# Patient Record
Sex: Male | Born: 1955 | Race: White | Hispanic: No | Marital: Single | Smoking: Current every day smoker
Health system: Southern US, Community
[De-identification: ages and names within clinical notes are randomized; demographics above are authoritative.]

## PROBLEM LIST (undated history)

## (undated) DIAGNOSIS — Z720 Tobacco use: Secondary | ICD-10-CM

## (undated) DIAGNOSIS — R590 Localized enlarged lymph nodes: Secondary | ICD-10-CM

## (undated) DIAGNOSIS — M069 Rheumatoid arthritis, unspecified: Secondary | ICD-10-CM

## (undated) DIAGNOSIS — R911 Solitary pulmonary nodule: Secondary | ICD-10-CM

## (undated) DIAGNOSIS — R600 Localized edema: Secondary | ICD-10-CM

## (undated) DIAGNOSIS — J439 Emphysema, unspecified: Secondary | ICD-10-CM

## (undated) DIAGNOSIS — J449 Chronic obstructive pulmonary disease, unspecified: Secondary | ICD-10-CM

## (undated) HISTORY — PX: INGUINAL HERNIA REPAIR: SUR1180

---

## 2011-06-13 ENCOUNTER — Ambulatory Visit: Payer: Self-pay | Admitting: Family Medicine

## 2011-06-21 ENCOUNTER — Other Ambulatory Visit (HOSPITAL_COMMUNITY): Payer: Self-pay | Admitting: Physician Assistant

## 2011-06-21 ENCOUNTER — Ambulatory Visit (HOSPITAL_COMMUNITY)
Admission: RE | Admit: 2011-06-21 | Discharge: 2011-06-21 | Disposition: A | Discharge status: Home / Self Care | Payer: 59 | Source: Ambulatory Visit | Attending: Physician Assistant | Admitting: Physician Assistant

## 2011-06-21 DIAGNOSIS — M25469 Effusion, unspecified knee: Secondary | ICD-10-CM | POA: Insufficient documentation

## 2011-06-21 DIAGNOSIS — M25569 Pain in unspecified knee: Secondary | ICD-10-CM | POA: Insufficient documentation

## 2011-07-27 DIAGNOSIS — M069 Rheumatoid arthritis, unspecified: Secondary | ICD-10-CM

## 2011-07-27 HISTORY — DX: Rheumatoid arthritis, unspecified: M06.9

## 2012-01-29 ENCOUNTER — Emergency Department (HOSPITAL_COMMUNITY): Payer: 59

## 2012-01-29 ENCOUNTER — Encounter (HOSPITAL_COMMUNITY): Payer: Self-pay

## 2012-01-29 ENCOUNTER — Inpatient Hospital Stay (HOSPITAL_COMMUNITY)
Admission: EM | Admit: 2012-01-29 | Discharge: 2012-01-31 | DRG: 947 | Disposition: A | Discharge status: Home / Self Care | Payer: 59 | Attending: Internal Medicine | Admitting: Internal Medicine

## 2012-01-29 DIAGNOSIS — I82409 Acute embolism and thrombosis of unspecified deep veins of unspecified lower extremity: Secondary | ICD-10-CM

## 2012-01-29 DIAGNOSIS — R599 Enlarged lymph nodes, unspecified: Secondary | ICD-10-CM | POA: Diagnosis present

## 2012-01-29 DIAGNOSIS — M2550 Pain in unspecified joint: Secondary | ICD-10-CM | POA: Diagnosis present

## 2012-01-29 DIAGNOSIS — L818 Other specified disorders of pigmentation: Secondary | ICD-10-CM | POA: Diagnosis present

## 2012-01-29 DIAGNOSIS — D509 Iron deficiency anemia, unspecified: Secondary | ICD-10-CM | POA: Diagnosis present

## 2012-01-29 DIAGNOSIS — F17200 Nicotine dependence, unspecified, uncomplicated: Secondary | ICD-10-CM | POA: Diagnosis present

## 2012-01-29 DIAGNOSIS — Z791 Long term (current) use of non-steroidal anti-inflammatories (NSAID): Secondary | ICD-10-CM

## 2012-01-29 DIAGNOSIS — T40605A Adverse effect of unspecified narcotics, initial encounter: Secondary | ICD-10-CM | POA: Diagnosis not present

## 2012-01-29 DIAGNOSIS — R609 Edema, unspecified: Principal | ICD-10-CM

## 2012-01-29 DIAGNOSIS — Y9210 Unspecified residential institution as the place of occurrence of the external cause: Secondary | ICD-10-CM | POA: Diagnosis not present

## 2012-01-29 DIAGNOSIS — T3995XA Adverse effect of unspecified nonopioid analgesic, antipyretic and antirheumatic, initial encounter: Secondary | ICD-10-CM | POA: Diagnosis present

## 2012-01-29 DIAGNOSIS — M62838 Other muscle spasm: Secondary | ICD-10-CM | POA: Diagnosis present

## 2012-01-29 DIAGNOSIS — Z720 Tobacco use: Secondary | ICD-10-CM | POA: Diagnosis present

## 2012-01-29 DIAGNOSIS — G9340 Encephalopathy, unspecified: Secondary | ICD-10-CM | POA: Diagnosis not present

## 2012-01-29 DIAGNOSIS — E8809 Other disorders of plasma-protein metabolism, not elsewhere classified: Secondary | ICD-10-CM | POA: Diagnosis present

## 2012-01-29 DIAGNOSIS — R590 Localized enlarged lymph nodes: Secondary | ICD-10-CM | POA: Diagnosis present

## 2012-01-29 DIAGNOSIS — J439 Emphysema, unspecified: Secondary | ICD-10-CM | POA: Diagnosis present

## 2012-01-29 DIAGNOSIS — J4489 Other specified chronic obstructive pulmonary disease: Secondary | ICD-10-CM | POA: Diagnosis present

## 2012-01-29 DIAGNOSIS — R600 Localized edema: Secondary | ICD-10-CM | POA: Diagnosis present

## 2012-01-29 DIAGNOSIS — N200 Calculus of kidney: Secondary | ICD-10-CM | POA: Diagnosis present

## 2012-01-29 DIAGNOSIS — D649 Anemia, unspecified: Secondary | ICD-10-CM | POA: Diagnosis present

## 2012-01-29 DIAGNOSIS — R911 Solitary pulmonary nodule: Secondary | ICD-10-CM | POA: Diagnosis present

## 2012-01-29 DIAGNOSIS — M069 Rheumatoid arthritis, unspecified: Secondary | ICD-10-CM | POA: Diagnosis present

## 2012-01-29 DIAGNOSIS — J449 Chronic obstructive pulmonary disease, unspecified: Secondary | ICD-10-CM

## 2012-01-29 DIAGNOSIS — Y92009 Unspecified place in unspecified non-institutional (private) residence as the place of occurrence of the external cause: Secondary | ICD-10-CM

## 2012-01-29 HISTORY — DX: Localized edema: R60.0

## 2012-01-29 HISTORY — DX: Rheumatoid arthritis, unspecified: M06.9

## 2012-01-29 HISTORY — DX: Chronic obstructive pulmonary disease, unspecified: J44.9

## 2012-01-29 HISTORY — DX: Tobacco use: Z72.0

## 2012-01-29 HISTORY — DX: Solitary pulmonary nodule: R91.1

## 2012-01-29 HISTORY — DX: Emphysema, unspecified: J43.9

## 2012-01-29 HISTORY — DX: Localized enlarged lymph nodes: R59.0

## 2012-01-29 LAB — COMPREHENSIVE METABOLIC PANEL
BUN: 6 mg/dL (ref 6–23)
CO2: 27 mEq/L (ref 19–32)
Chloride: 99 mEq/L (ref 96–112)
Creatinine, Ser: 0.43 mg/dL — ABNORMAL LOW (ref 0.50–1.35)
GFR calc Af Amer: >90 mL/min (ref >90)
GFR calc non Af Amer: >90 mL/min (ref >90)
Total Bilirubin: 0.2 mg/dL — ABNORMAL LOW (ref 0.3–1.2)

## 2012-01-29 LAB — CBC WITH DIFFERENTIAL/PLATELET
Eosinophils Relative: 1 % (ref 0–5)
HCT: 34 % — ABNORMAL LOW (ref 39.0–52.0)
Hemoglobin: 10.9 g/dL — ABNORMAL LOW (ref 13.0–17.0)
Lymphocytes Relative: 25 % (ref 12–46)
MCHC: 32.1 g/dL (ref 30.0–36.0)
MCV: 76.7 fL — ABNORMAL LOW (ref 78.0–100.0)
Monocytes Absolute: 0.4 10*3/uL (ref 0.1–1.0)
Monocytes Relative: 6 % (ref 3–12)
Neutro Abs: 4.4 10*3/uL (ref 1.7–7.7)

## 2012-01-29 LAB — URINALYSIS, ROUTINE W REFLEX MICROSCOPIC
Leukocytes, UA: NEGATIVE
Nitrite: NEGATIVE
Protein, ur: NEGATIVE mg/dL
Urobilinogen, UA: 2 mg/dL — ABNORMAL HIGH (ref 0.0–1.0)

## 2012-01-29 LAB — PRO B NATRIURETIC PEPTIDE: Pro B Natriuretic peptide (BNP): 222.7 pg/mL — ABNORMAL HIGH (ref 0–125)

## 2012-01-29 LAB — PROTIME-INR: Prothrombin Time: 14.6 seconds (ref 11.6–15.2)

## 2012-01-29 LAB — T4, FREE: Free T4: 0.95 ng/dL (ref 0.80–1.80)

## 2012-01-29 MED ORDER — NICOTINE 21 MG/24HR TD PT24
21.0000 mg | MEDICATED_PATCH | Freq: Every day | TRANSDERMAL | Status: DC
  Administered 2012-01-30 – 2012-01-31 (×2): 21 mg via TRANSDERMAL
  Filled 2012-01-29 (×3): qty 1

## 2012-01-29 MED ORDER — SODIUM CHLORIDE 0.9 % IJ SOLN
INTRAMUSCULAR | Status: AC
  Administered 2012-01-29: 21:00:00
  Filled 2012-01-29: qty 3

## 2012-01-29 MED ORDER — MORPHINE SULFATE 4 MG/ML IJ SOLN
4.0000 mg | INTRAMUSCULAR | Status: DC | PRN
  Administered 2012-01-30 – 2012-01-31 (×2): 4 mg via INTRAVENOUS
  Filled 2012-01-29 (×3): qty 1

## 2012-01-29 MED ORDER — ACETAMINOPHEN 650 MG RE SUPP: 650.0000 mg | Freq: Four times a day (QID) | RECTAL | Status: DC | PRN

## 2012-01-29 MED ORDER — ENOXAPARIN SODIUM 80 MG/0.8ML ~~LOC~~ SOLN
SUBCUTANEOUS | Status: AC
  Filled 2012-01-29: qty 0.8

## 2012-01-29 MED ORDER — ALBUTEROL SULFATE HFA 108 (90 BASE) MCG/ACT IN AERS
2.0000 | INHALATION_SPRAY | Freq: Three times a day (TID) | RESPIRATORY_TRACT | Status: DC
  Administered 2012-01-29 – 2012-01-31 (×6): 2 via RESPIRATORY_TRACT
  Filled 2012-01-29: qty 6.7

## 2012-01-29 MED ORDER — ONDANSETRON HCL 4 MG PO TABS: 4.0000 mg | ORAL_TABLET | Freq: Four times a day (QID) | ORAL | Status: DC | PRN

## 2012-01-29 MED ORDER — ALUM & MAG HYDROXIDE-SIMETH 200-200-20 MG/5ML PO SUSP: 30.0000 mL | Freq: Four times a day (QID) | ORAL | Status: DC | PRN

## 2012-01-29 MED ORDER — ACETAMINOPHEN 325 MG PO TABS: 650.0000 mg | ORAL_TABLET | Freq: Four times a day (QID) | ORAL | Status: DC | PRN

## 2012-01-29 MED ORDER — FAMOTIDINE 20 MG PO TABS
20.0000 mg | ORAL_TABLET | Freq: Two times a day (BID) | ORAL | Status: DC
  Administered 2012-01-29 – 2012-01-31 (×4): 20 mg via ORAL
  Filled 2012-01-29 (×4): qty 1

## 2012-01-29 MED ORDER — ENOXAPARIN SODIUM 80 MG/0.8ML ~~LOC~~ SOLN
1.0000 mg/kg | Freq: Two times a day (BID) | SUBCUTANEOUS | Status: DC
  Administered 2012-01-29: 65 mg via SUBCUTANEOUS
  Filled 2012-01-29: qty 0.8

## 2012-01-29 MED ORDER — ENOXAPARIN SODIUM 40 MG/0.4ML ~~LOC~~ SOLN
70.0000 mg | Freq: Once | SUBCUTANEOUS | Status: AC
  Administered 2012-01-29: 70 mg via SUBCUTANEOUS

## 2012-01-29 MED ORDER — MORPHINE SULFATE 4 MG/ML IJ SOLN
4.0000 mg | Freq: Once | INTRAMUSCULAR | Status: AC
  Administered 2012-01-29: 4 mg via INTRAVENOUS
  Filled 2012-01-29: qty 1

## 2012-01-29 MED ORDER — ONDANSETRON HCL 4 MG/2ML IJ SOLN: 4.0000 mg | Freq: Four times a day (QID) | INTRAMUSCULAR | Status: DC | PRN

## 2012-01-29 MED ORDER — METHOCARBAMOL 500 MG PO TABS: 500.0000 mg | ORAL_TABLET | Freq: Three times a day (TID) | ORAL | Status: DC | PRN

## 2012-01-29 MED ORDER — OXYCODONE HCL 5 MG PO TABS
5.0000 mg | ORAL_TABLET | ORAL | Status: DC | PRN
  Administered 2012-01-29 – 2012-01-30 (×5): 5 mg via ORAL
  Filled 2012-01-29 (×6): qty 1

## 2012-01-29 MED ORDER — SENNA 8.6 MG PO TABS
1.0000 | ORAL_TABLET | Freq: Every day | ORAL | Status: DC
  Administered 2012-01-29 – 2012-01-30 (×2): 8.6 mg via ORAL
  Filled 2012-01-29 (×4): qty 1

## 2012-01-29 MED ORDER — FUROSEMIDE 20 MG PO TABS
30.0000 mg | ORAL_TABLET | Freq: Two times a day (BID) | ORAL | Status: DC
  Administered 2012-01-29 – 2012-01-31 (×4): 30 mg via ORAL
  Filled 2012-01-29 (×4): qty 2

## 2012-01-29 MED ORDER — ENOXAPARIN SODIUM 80 MG/0.8ML ~~LOC~~ SOLN: 1.0000 mg/kg | Freq: Two times a day (BID) | SUBCUTANEOUS | Status: DC

## 2012-01-29 MED ORDER — POTASSIUM CHLORIDE CRYS ER 20 MEQ PO TBCR
20.0000 meq | EXTENDED_RELEASE_TABLET | Freq: Two times a day (BID) | ORAL | Status: DC
  Administered 2012-01-29 – 2012-01-31 (×4): 20 meq via ORAL
  Filled 2012-01-29 (×4): qty 1

## 2012-01-29 MED ORDER — IOHEXOL 350 MG/ML SOLN
100.0000 mL | Freq: Once | INTRAVENOUS | Status: AC | PRN
  Administered 2012-01-29: 100 mL via INTRAVENOUS

## 2012-01-29 MED ORDER — POTASSIUM CHLORIDE IN NACL 20-0.9 MEQ/L-% IV SOLN
INTRAVENOUS | Status: DC
  Administered 2012-01-29: 19:00:00 via INTRAVENOUS

## 2012-01-29 NOTE — ED Notes (Signed)
Pt c/o feet and leg swelling x 2 months.   Denies any chest pain or shortness of breath.

## 2012-01-29 NOTE — Progress Notes (Addendum)
ANTICOAGULATION CONSULT NOTE - Initial Consult  Pharmacy Consult for Lovenox Indication: DVT  No Known Allergies  Patient Measurements: Height: 5\' 8"  (172.7 cm) Weight: 144 lb 13.1 oz (65.69 kg) IBW/kg (Calculated) : 68.4  Heparin Dosing Weight: 65.7 kg  Vital Signs: Temp: 97.9 F (36.6 C) (11/03 1802) Temp src: Oral (11/03 1802) BP: 120/74 mmHg (11/03 1802) Pulse Rate: 98  (11/03 1802)  Labs:  Basename 01/29/12 1425  HGB 10.9*  HCT 34.0*  PLT 407*  APTT 31  LABPROT 14.6  INR 1.16  HEPARINUNFRC --  CREATININE 0.43*  CKTOTAL --  CKMB --  TROPONINI --    Estimated Creatinine Clearance: 93.5 ml/min (by C-G formula based on Cr of 0.43).   Medical History: Past Medical History  Diagnosis Date  . Rheumatoid arthritis   . COPD (chronic obstructive pulmonary disease)   . Tobacco abuse     Medications:  Scheduled:    . albuterol  2 puff Inhalation TID  . enoxaparin      . enoxaparin (LOVENOX) injection  1 mg/kg Subcutaneous Q12H  . [COMPLETED] enoxaparin  70 mg Subcutaneous Once  . famotidine  20 mg Oral BID  . furosemide  30 mg Oral BID  . [COMPLETED] morphine  4 mg Intravenous Once  . nicotine  21 mg Transdermal Daily  . potassium chloride  20 mEq Oral BID  . senna  1 tablet Oral QHS  . sodium chloride      . [DISCONTINUED] enoxaparin (LOVENOX) injection  1 mg/kg Subcutaneous Q12H     Assessment: Possible bilateral lower extremity deep vein thromboses  Lovenox 70 mg SQ given in ER at 415 PM Lovenox 65 mg SQ given on nursing unit at 5 PM RN contacted pharmacy concerning medication duplication Safety zone report has been filed by RN  Goal of Therapy:  Therapeutic lovenox dosing for DVT Monitor platelets by anticoagulation protocol: Yes   Plan:  1. Recommend holding next Lovenox dose until 5 PM tomorrow 2. Start Lovenox 65 mg (1mg /kg) SQ every 12 hours at 5 PM tomorrow 3. Monitor patient for any bleeding issues and contact MD if concerns arise 4.  CBC tomorrow AM    Raquel James, Tag Wurtz Bennett 01/29/2012,9:01 PM

## 2012-01-29 NOTE — ED Provider Notes (Cosign Needed)
History   This chart was scribed for Carleene Cooper III, MD by Charolett Bumpers . The patient was seen in room APA04/APA04. Patient's care was started at 1320.   CSN: [id number]  Arrival date & time 01/29/12  1132   First MD Initiated Contact with Patient 01/29/12 1320      Chief Complaint  Patient presents with  . Foot Swelling  . Leg Swelling    The history is provided by the patient. No language interpreter was used.  Joe Mccullough. is a 56 y.o. male who presents to the Emergency Department complaining of constant, severe graduallly worsening blaterally feet/ankle swelling for the past 2 months. He also reports shooting pain behind right knee. He reports a h/o swelling and takes Lasix. He denies any chest pain or SOB. He reports smoking a pack daily. He states that he has not seen Dr. Loreta Ave recently. He also takes Celebrex and Methotrexate regularly.   PCP: Dr. Phillips Odor  Past Medical History  Diagnosis Date  . Swelling     History reviewed. No pertinent past surgical history.  No family history on file.  History  Substance Use Topics  . Smoking status: Current Every Day Smoker  . Smokeless tobacco: Not on file  . Alcohol Use: No     Comment: former      Review of Systems  Constitutional: Negative for fever and chills.  Respiratory: Negative for shortness of breath.   Cardiovascular: Positive for leg swelling. Negative for chest pain.  Gastrointestinal: Negative for nausea and vomiting.  Musculoskeletal:       Feet swelling.   Neurological: Negative for weakness.  All other systems reviewed and are negative.    Allergies  Review of patient's allergies indicates no known allergies.  Home Medications  No current outpatient prescriptions on file.  BP 134/76  Pulse 94  Temp 97.5 F (36.4 C) (Oral)  Resp 20  Ht 5\' 8"  (1.727 m)  Wt 145 lb (65.772 kg)  BMI 22.05 kg/m2  SpO2 100%  Physical Exam  Nursing note and vitals reviewed. Constitutional:  He is oriented to person, place, and time. He appears well-developed and well-nourished. No distress.  HENT:  Head: Normocephalic and atraumatic.  Right Ear: External ear normal.  Left Ear: External ear normal.  Nose: Nose normal.  Mouth/Throat: Oropharynx is clear and moist. No oropharyngeal exudate.       Poor dentition throughout.   Eyes: Conjunctivae normal and EOM are normal. Pupils are equal, round, and reactive to light.  Neck: Normal range of motion. Neck supple. No tracheal deviation present.  Cardiovascular: Normal rate, regular rhythm and normal heart sounds.   No murmur heard. Pulmonary/Chest: Effort normal and breath sounds normal. No respiratory distress. He has no wheezes.  Abdominal: Soft. Bowel sounds are normal. He exhibits no distension. There is no tenderness.  Musculoskeletal: Normal range of motion. He exhibits edema and tenderness.       Lower extremities at feet are very edematous. Tenderness noted in the right popliteal fossa.   Neurological: He is alert and oriented to person, place, and time. He has normal strength. No cranial nerve deficit or sensory deficit.  Skin: Skin is warm and dry.  Psychiatric: He has a normal mood and affect. His behavior is normal.    ED Course  Procedures (including critical care time)  DIAGNOSTIC STUDIES: Oxygen Saturation is 100% on room air, normal by my interpretation.    COORDINATION OF CARE:  13:50-Discussed planned course of  treatment with the patient, who is agreeable at this time.    Date: 01/29/2012  Rate: 88  Rhythm: normal sinus rhythm  QRS Axis: normal  Intervals: normal  ST/T Wave abnormalities: normal  Conduction Disutrbances:none  Narrative Interpretation: Normal EKG  Old EKG Reviewed: none available  Results for orders placed during the hospital encounter of 01/29/12  CBC WITH DIFFERENTIAL      Component Value Range   WBC 6.5  4.0 - 10.5 K/uL   RBC 4.43  4.22 - 5.81 MIL/uL   Hemoglobin 10.9 (*) 13.0  - 17.0 g/dL   HCT 16.1 (*) 09.6 - 04.5 %   MCV 76.7 (*) 78.0 - 100.0 fL   MCH 24.6 (*) 26.0 - 34.0 pg   MCHC 32.1  30.0 - 36.0 g/dL   RDW 40.9 (*) 81.1 - 91.4 %   Platelets 407 (*) 150 - 400 K/uL   Neutrophils Relative 67  43 - 77 %   Neutro Abs 4.4  1.7 - 7.7 K/uL   Lymphocytes Relative 25  12 - 46 %   Lymphs Abs 1.6  0.7 - 4.0 K/uL   Monocytes Relative 6  3 - 12 %   Monocytes Absolute 0.4  0.1 - 1.0 K/uL   Eosinophils Relative 1  0 - 5 %   Eosinophils Absolute 0.1  0.0 - 0.7 K/uL   Basophils Relative 1  0 - 1 %   Basophils Absolute 0.0  0.0 - 0.1 K/uL  COMPREHENSIVE METABOLIC PANEL      Component Value Range   Sodium 134 (*) 135 - 145 mEq/L   Potassium 3.9  3.5 - 5.1 mEq/L   Chloride 99  96 - 112 mEq/L   CO2 27  19 - 32 mEq/L   Glucose, Bld 92  70 - 99 mg/dL   BUN 6  6 - 23 mg/dL   Creatinine, Ser 7.82 (*) 0.50 - 1.35 mg/dL   Calcium 8.8  8.4 - 95.6 mg/dL   Total Protein 7.1  6.0 - 8.3 g/dL   Albumin 2.0 (*) 3.5 - 5.2 g/dL   AST 14  0 - 37 U/L   ALT 11  0 - 53 U/L   Alkaline Phosphatase 119 (*) 39 - 117 U/L   Total Bilirubin 0.2 (*) 0.3 - 1.2 mg/dL   GFR calc non Af Amer >90  >90 mL/min   GFR calc Af Amer >90  >90 mL/min  URINALYSIS, ROUTINE W REFLEX MICROSCOPIC      Component Value Range   Color, Urine YELLOW  YELLOW   APPearance CLEAR  CLEAR   Specific Gravity, Urine 1.010  1.005 - 1.030   pH 7.5  5.0 - 8.0   Glucose, UA NEGATIVE  NEGATIVE mg/dL   Hgb urine dipstick NEGATIVE  NEGATIVE   Bilirubin Urine NEGATIVE  NEGATIVE   Ketones, ur NEGATIVE  NEGATIVE mg/dL   Protein, ur NEGATIVE  NEGATIVE mg/dL   Urobilinogen, UA 2.0 (*) 0.0 - 1.0 mg/dL   Nitrite NEGATIVE  NEGATIVE   Leukocytes, UA NEGATIVE  NEGATIVE  PROTIME-INR      Component Value Range   Prothrombin Time 14.6  11.6 - 15.2 seconds   INR 1.16  0.00 - 1.49  APTT      Component Value Range   aPTT 31  24 - 37 seconds  D-DIMER, QUANTITATIVE      Component Value Range   D-Dimer, Quant >20.00 (*) 0.00 -  0.48 ug/mL-FEU  PRO B NATRIURETIC PEPTIDE  Component Value Range   Pro B Natriuretic peptide (BNP) 222.7 (*) 0 - 125 pg/mL   Dg Chest 2 View  01/29/2012  *RADIOLOGY REPORT*  Clinical Data: Bilateral leg swelling.  CHEST - 2 VIEW  Comparison: None.  Findings: Normal sized heart.  Clear lungs.  The lungs are mildly hyperexpanded with mildly prominent interstitial markings and mild diffuse peribronchial thickening.  Left nipple shadow.  Minimal thoracic spine degenerative changes.  IMPRESSION: No acute abnormality.  Mild changes of COPD and chronic bronchitis.   Original Report Authenticated By: Beckie Salts, M.D.     Lab workup shows D-dimer elevated at > 20, suggests DVT as cause of his leg swelling.  Call to [facility] to admit him.  Plan to treat with lovenox, get venous dopper study in A.M.   4:08 PM Case discussed with Dr. Sherrie Mustache, who will admit pt to Team 2 to a telemetry bed.  She requests that CT angio of the chest be done.   1. Deep venous thrombosis    I personally performed the services described in this documentation, which was scribed in my presence. The recorded information has been reviewed and considered.  Osvaldo Human, MD        Carleene Cooper III, MD 01/29/12 [phone]

## 2012-01-29 NOTE — H&P (Signed)
[facility] History and Physical  Nakeem Murnane. WUJ:[id number] DOB: [birth year] DOA: 01/29/2012  Referring physician: Carleene Cooper, M.D. PCP: Colette Ribas, MD  Specialists:   Chief Complaint: Swelling of both legs, right leg cramping  HPI: Zahari Xiang. is a 56 y.o. male  with a history significant for COPD and rheumatoid arthritis, who presents to the emergency department with a chief complaint of swelling of both of his legs. The swelling started approximately 2-3 months ago. It has been progressive. He was started on treatment with furosemide by his primary care provider. It helped some, but it did not take the swelling completely away. He has chronic joint pain, primarily in his knees. He was diagnosed with rheumatoid arthritis few months ago by his primary care provider. He was started on methotrexate and Celebrex at that time. He has occasional cramping in his right leg greater than left leg. He has difficulty walking because of the swelling. He has generalized pain over both legs because of the swelling. He denies trauma to his legs or feet. He denies any history of congestive heart failure. He denies history of cirrhosis or liver disease. He has intermittent shortness of breath with activity and at rest. He has a chronic productive cough with brownish and clear colored sputum. His review of systems is positive as well for night sweats. He denies unintentional weight loss, bright red blood per rectum, black tarry stools, headache, dizziness, and pain with urination.  In the emergency department, he is afebrile and hemodynamically stable. His chest x-ray shows changes of COPD and chronic bronchitis. His lab data are significant for hemoglobin of 10.9, MCV of 76.7, albumin of 2.0, and a d-dimer of greater than 20. He is being admitted for further evaluation and management. He   Review of Systems: as above in history present illness, otherwise negative.  Past Medical  History  Diagnosis Date  . Rheumatoid arthritis   . COPD (chronic obstructive pulmonary disease)   . Tobacco abuse    Surgical history: He had an inguinal hernia repair as a child.  Social History:  He is single. He lives in [place]. He has one daughter. He is employed at  [employer]. He smokes one pack of cigarettes per day and has been doing so for 40 years. He stopped drinking alcohol one year ago. He denies illicit drug use.  No Known Allergies  Family history: His mother is in her 82s. She recently had a stroke. His father died of a farming accident.  Prior to Admission medications   Medication Sig Start Date End Date Taking? Authorizing Provider  furosemide (LASIX) 20 MG tablet Take 20 mg by mouth daily.   Yes Historical Provider, MD  methotrexate 2.5 MG tablet Take 7.5 mg by mouth once a week.   Yes Historical Provider, MD  nabumetone (RELAFEN) 500 MG tablet Take 500 mg by mouth 2 (two) times daily.   Yes Historical Provider, MD   Physical Exam: Filed Vitals:   01/29/12 1203 01/29/12 1526  BP: 134/76 129/82  Pulse: 94 89  Temp: 97.5 F (36.4 C)   TempSrc: Oral   Resp: 20 19  Height: 5\' 8"  (1.727 m)   Weight: 65.772 kg (145 lb)   SpO2: 100% 100%     General:  Small framed 56 year old Caucasian man who appears to be significantly older than his stated age. He is in no acute distress.  Eyes: pupils are equal, round, and reactive to light. Extraocular movements are intact. Conjunctivae  are clear. Sclerae are white.  ENT: oropharynx reveals mildly dry mucous membranes. No posterior exudates or edema. He has multiple missing teeth.  Neck: supple, no adenopathy, no thyromegaly. No JVD.  Cardiovascular: S1, S2, with no murmurs rubs or gallops.  Respiratory: occasional wheezes bilaterally. Breathing is nonlabored.  Abdomen: soft, positive bowel sounds, mildly tender at the hypogastrium, no rigidity, no significant distention.  Skin: fair skin turgor. Multiple colored and  non-colored tattoos on the arms and legs bilaterally. Scant erythema over both feet.  Musculoskeletal: mild hypertrophic changes seen in his MCPs and DIPs bilaterally. He has 3-4+ bilateral lower extremity pitting edema of both legs and feet. His legs are diffusely and mildly tender to palpation. His feet are warm. Difficult to palpate pulses because of the edema.  Psychiatric: he is alert and oriented x3. His speech is clear. He is cooperative. He has a flat affect.  Neurologic: Cranial nerves II through XII are intact. Decrease in strength of his lower extremities because of edema, but he is able to raise his legs bilaterally at least 20. Upper extremity strength is 5 over 5. Sensation is grossly intact.  Labs on Admission:  Basic Metabolic Panel:  Lab 01/29/12 6644  NA 134*  K 3.9  CL 99  CO2 27  GLUCOSE 92  BUN 6  CREATININE 0.43*  CALCIUM 8.8  MG --  PHOS --   Liver Function Tests:  Lab 01/29/12 1425  AST 14  ALT 11  ALKPHOS 119*  BILITOT 0.2*  PROT 7.1  ALBUMIN 2.0*   No results found for this basename: LIPASE:5,AMYLASE:5 in the last 168 hours No results found for this basename: AMMONIA:5 in the last 168 hours CBC:  Lab 01/29/12 1425  WBC 6.5  NEUTROABS 4.4  HGB 10.9*  HCT 34.0*  MCV 76.7*  PLT 407*   Cardiac Enzymes: No results found for this basename: CKTOTAL:5,CKMB:5,CKMBINDEX:5,TROPONINI:5 in the last 168 hours  BNP (last 3 results)  Basename 01/29/12 1425  PROBNP 222.7*   CBG: No results found for this basename: GLUCAP:5 in the last 168 hours  Radiological Exams on Admission: Dg Chest 2 View  01/29/2012  *RADIOLOGY REPORT*  Clinical Data: Bilateral leg swelling.  CHEST - 2 VIEW  Comparison: None.  Findings: Normal sized heart.  Clear lungs.  The lungs are mildly hyperexpanded with mildly prominent interstitial markings and mild diffuse peribronchial thickening.  Left nipple shadow.  Minimal thoracic spine degenerative changes.  IMPRESSION: No  acute abnormality.  Mild changes of COPD and chronic bronchitis.   Original Report Authenticated By: Beckie Salts, M.D.       Assessment/Plan Active Problems:  Bilateral lower extremity edema  Joint pain of lower extremity  Muscle spasm of right leg  Tobacco abuse  COPD (chronic obstructive pulmonary disease) with chronic bronchitis  Microcytic anemia  Extensive tattoos  Hypoalbuminemia   1. This is a 56 year old man with a recent diagnosis of rheumatoid arthritis, presents with progressive bilateral lower extremity edema. He appears to have bilateral lower extremity deep vein thromboses until proven otherwise. His d-dimer is greater than 20 which is impressive. He takes Celebrex and Relafen as needed which could contribute to generalized edema. The edema may be, in part, attributable to hypoalbuminemia. The etiology of his hypoalbuminemia is unclear. He does have impressive tattoos, but his AST and ALT are within normal limits. His PT/PTT is within normal limits. His proBNP is marginally elevated, but not consistent with congestive heart failure. He is noted to have microcytic anemia.  Underlying or occult malignancy is a concern.    Plan: 1. Discussed with ED physician Dr. Ignacia Palma. We both agree that Lovenox should be started empirically. We also agreed that CT angiogram of his chest is warranted in this patient with chronic bronchitis and a 40-pack-year of tobacco abuse. 2. We'll start analgesics as needed for his pain and when necessary antispasmodics.. Will hold on methotrexate and Celebrex. 3. Gentle Lasix for treatment of bilateral lower extremity edema. 4. We'll order to keep his legs elevated. 5. We'll place a nicotine patch. We'll order tobacco cessation counseling. The patient was advised to stop smoking. We'll add albuterol inhaler for bronchospasms.  6. We'll add H2 blockade empirically.  7. For further evaluation, we'll order bilateral lower extremity venous ultrasound, an  anemia panel, TSH, free T4, vitamin B12 level, and 2-D echocardiogram. Will guaiac his stools.  Code Status: full code Family Communication: no family available Disposition Plan: discharge to home when medically improved. Length of stay is unknown at this time.  Time spent: one hour  Haven Foss [facility]  If 7PM-7AM, please contact night-coverage [email or web address] Password [email or web address] 01/29/2012, 4:28 PM

## 2012-01-30 ENCOUNTER — Inpatient Hospital Stay (HOSPITAL_COMMUNITY): Payer: 59

## 2012-01-30 ENCOUNTER — Observation Stay (HOSPITAL_COMMUNITY): Payer: 59

## 2012-01-30 ENCOUNTER — Encounter (HOSPITAL_COMMUNITY): Payer: Self-pay | Admitting: Internal Medicine

## 2012-01-30 DIAGNOSIS — E8809 Other disorders of plasma-protein metabolism, not elsewhere classified: Secondary | ICD-10-CM

## 2012-01-30 DIAGNOSIS — R609 Edema, unspecified: Secondary | ICD-10-CM

## 2012-01-30 DIAGNOSIS — J449 Chronic obstructive pulmonary disease, unspecified: Secondary | ICD-10-CM

## 2012-01-30 DIAGNOSIS — J439 Emphysema, unspecified: Secondary | ICD-10-CM

## 2012-01-30 HISTORY — DX: Emphysema, unspecified: J43.9

## 2012-01-30 LAB — VITAMIN B12: Vitamin B-12: 285 pg/mL (ref 211–911)

## 2012-01-30 LAB — IRON AND TIBC: Iron: 12 ug/dL — ABNORMAL LOW (ref 42–135)

## 2012-01-30 LAB — AMMONIA: Ammonia: 15 umol/L (ref 11–60)

## 2012-01-30 LAB — COMPREHENSIVE METABOLIC PANEL
ALT: 9 U/L (ref 0–53)
Alkaline Phosphatase: 108 U/L (ref 39–117)
BUN: 9 mg/dL (ref 6–23)
CO2: 28 mEq/L (ref 19–32)
Calcium: 8.6 mg/dL (ref 8.4–10.5)
GFR calc Af Amer: >90 mL/min (ref >90)
GFR calc non Af Amer: >90 mL/min (ref >90)
Glucose, Bld: 100 mg/dL — ABNORMAL HIGH (ref 70–99)
Sodium: 134 mEq/L — ABNORMAL LOW (ref 135–145)
Total Protein: 6.8 g/dL (ref 6.0–8.3)

## 2012-01-30 LAB — CBC
HCT: 31.8 % — ABNORMAL LOW (ref 39.0–52.0)
Hemoglobin: 10 g/dL — ABNORMAL LOW (ref 13.0–17.0)
MCHC: 31.4 g/dL (ref 30.0–36.0)
RDW: 17.2 % — ABNORMAL HIGH (ref 11.5–15.5)
WBC: 6.8 10*3/uL (ref 4.0–10.5)

## 2012-01-30 LAB — RETICULOCYTES
RBC.: 4.13 MIL/uL — ABNORMAL LOW (ref 4.22–5.81)
Retic Count, Absolute: 45.4 10*3/uL (ref 19.0–186.0)

## 2012-01-30 LAB — SEDIMENTATION RATE: Sed Rate: 125 mm/hr — ABNORMAL HIGH (ref 0–16)

## 2012-01-30 MED ORDER — ADULT MULTIVITAMIN W/MINERALS CH
1.0000 | ORAL_TABLET | Freq: Every day | ORAL | Status: DC
  Administered 2012-01-30 – 2012-01-31 (×2): 1 via ORAL
  Filled 2012-01-30 (×2): qty 1

## 2012-01-30 MED ORDER — PRO-STAT SUGAR FREE PO LIQD
30.0000 mL | Freq: Three times a day (TID) | ORAL | Status: DC
  Administered 2012-01-30: 30 mL via ORAL
  Filled 2012-01-30 (×2): qty 30

## 2012-01-30 NOTE — Progress Notes (Addendum)
Subjective: The patient is lying in bed. He complains of pain behind his right knee. He denies chest pain, shortness of breath, and abdominal pain.  Objective: Vital signs in last 24 hours: Filed Vitals:   01/29/12 2046 01/29/12 2126 01/30/12 0428 01/30/12 0658  BP:  102/67 109/58   Pulse:  103 91   Temp:  98 F (36.7 C) 98.3 F (36.8 C)   TempSrc:  Oral Oral   Resp:  19 20   Height:      Weight:   63.1 kg (139 lb 1.8 oz)   SpO2: 97% 93% 95% 96%    Intake/Output Summary (Last 24 hours) at 01/30/12 1044 Last data filed at 01/30/12 0800  Gross per 24 hour  Intake    120 ml  Output    900 ml  Net   -780 ml    Weight change:   Physical exam: General: Small framed disheveled appearing 56 year old Caucasian man who appears to be older than his stated age. He is in no acute distress. Lungs: Occasional wheezes, breathing nonlabored. Heart: S1, S2, with no murmurs rubs or gallops. Abdomen: Positive bowel sounds, soft, mildly tender in the hypogastrium, no rigidity, no obvious masses palpated. Extremities: Significantly decreased edema of both of his legs, down to 2+ from 3-4+. No acute hot red joints. Mild tenderness over his left greater than right legs globally.   Lab Results: Basic Metabolic Panel:  Basename 01/30/12 0456 01/29/12 1425  NA 134* 134*  K 4.0 3.9  CL 99 99  CO2 28 27  GLUCOSE 100* 92  BUN 9 6  CREATININE 0.60 0.43*  CALCIUM 8.6 8.8  MG -- --  PHOS -- --   Liver Function Tests:  Basename 01/30/12 0456 01/29/12 1425  AST 12 14  ALT 9 11  ALKPHOS 108 119*  BILITOT 0.1* 0.2*  PROT 6.8 7.1  ALBUMIN 1.9* 2.0*   No results found for this basename: LIPASE:2,AMYLASE:2 in the last 72 hours No results found for this basename: AMMONIA:2 in the last 72 hours CBC:  Basename 01/30/12 0456 01/29/12 1425  WBC 6.8 6.5  NEUTROABS -- 4.4  HGB 10.0* 10.9*  HCT 31.8* 34.0*  MCV 77.0* 76.7*  PLT 454* 407*   Cardiac Enzymes: No results found for this  basename: CKTOTAL:3,CKMB:3,CKMBINDEX:3,TROPONINI:3 in the last 72 hours BNP:  Basename 01/29/12 1425  PROBNP 222.7*   D-Dimer:  Basename 01/29/12 1425  DDIMER >20.00*   CBG: No results found for this basename: GLUCAP:6 in the last 72 hours Hemoglobin A1C: No results found for this basename: HGBA1C in the last 72 hours Fasting Lipid Panel: No results found for this basename: CHOL,HDL,LDLCALC,TRIG,CHOLHDL,LDLDIRECT in the last 72 hours Thyroid Function Tests:  Basename 01/29/12 1705  TSH --  T4TOTAL --  FREET4 0.95  T3FREE --  THYROIDAB --   Anemia Panel:  Basename 01/30/12 0456  VITAMINB12 --  FOLATE --  FERRITIN --  TIBC --  IRON --  RETICCTPCT 1.1   Coagulation:  Basename 01/29/12 1425  LABPROT 14.6  INR 1.16   Urine Drug Screen: Drugs of Abuse  No results found for this basename: labopia, cocainscrnur, labbenz, amphetmu, thcu, labbarb    Alcohol Level: No results found for this basename: ETH:2 in the last 72 hours Urinalysis:  Basename 01/29/12 1440  COLORURINE YELLOW  LABSPEC 1.010  PHURINE 7.5  GLUCOSEU NEGATIVE  HGBUR NEGATIVE  BILIRUBINUR NEGATIVE  KETONESUR NEGATIVE  PROTEINUR NEGATIVE  UROBILINOGEN 2.0*  NITRITE NEGATIVE  LEUKOCYTESUR NEGATIVE  Misc. Labs:   Micro: No results found for this or any previous visit (from the past 240 hour(s)).  Studies/Results: Dg Chest 2 View  01/29/2012  *RADIOLOGY REPORT*  Clinical Data: Bilateral leg swelling.  CHEST - 2 VIEW  Comparison: None.  Findings: Normal sized heart.  Clear lungs.  The lungs are mildly hyperexpanded with mildly prominent interstitial markings and mild diffuse peribronchial thickening.  Left nipple shadow.  Minimal thoracic spine degenerative changes.  IMPRESSION: No acute abnormality.  Mild changes of COPD and chronic bronchitis.   Original Report Authenticated By: Beckie Salts, M.D.    Ct Angio Chest Pe W/cm &/or Wo Cm  01/29/2012  *RADIOLOGY REPORT*  Clinical Data: Elevated  D-dimer.  Bilateral lower leg edema.  CT ANGIOGRAPHY CHEST  Technique:  Multidetector CT imaging of the chest using the standard protocol during bolus administration of intravenous contrast. Multiplanar reconstructed images including MIPs were obtained and reviewed to evaluate the vascular anatomy.  Contrast: OMNIPAQUE IOHEXOL 350 MG/ML SOLN  Comparison: None.  Findings: Lungs/pleura: No pleural effusion.  There are mild changes of centrilobular emphysema.  Dependent changes noted in the lung bases posteriorly.  No airspace consolidation.  No suspicious nodule identified.  Small granuloma is identified in the left upper lobe.  Heart/Mediastinum: Normal heart size.  No pericardial effusion. Prominent bilateral hilar lymph nodes.  The left hilar lymph node measures 1.8 cm, image 42.  Right hilar lymph node measures 1.1 cm. The pulmonary arteries appear patent without evidence for acute pulmonary embolus.  Upper abdomen: No acute findings identified.  No mass or adenopathy.  Bones/Musculoskeletal:  No axillary adenopathy identified.  Review of the visualized osseous structures is negative for acute bony abnormalities.  No aggressive lytic or sclerotic bone lesions identified.  IMPRESSION: 1.  No acute findings.  No evidence for pulmonary embolus. 2.  Emphysema 3.  Enlarged bilateral hilar lymph nodes.   Original Report Authenticated By: Signa Kell, M.D.    US Venous Img Lower Bilateral  01/30/2012  *RADIOLOGY REPORT*  Clinical Data: Bilateral lower extremity edema  BILATERAL LOWER EXTREMITY VENOUS DUPLEX ULTRASOUND  Technique:  Gray-scale sonography with graded compression, as well as color Doppler and duplex ultrasound, were performed to evaluate the deep venous system of both lower extremities from the level of the common femoral vein through the popliteal and proximal calf veins.  Spectral Doppler was utilized to evaluate flow at rest and with distal augmentation maneuvers.  Comparison:  None.  Findings:   Normal compressibility of bilateral common femoral, superficial femoral, and popliteal veins is demonstrated, as well as the visualized proximal calf veins.  No filling defects to suggest DVT on grayscale or color Doppler imaging.  Doppler waveforms show normal direction of venous flow, normal respiratory phasicity and response to augmentation.  IMPRESSION: No evidence of deep vein thrombosis in either lower extremity.   Original Report Authenticated By: Malachy Moan, M.D.     Medications:  Scheduled:   . albuterol  2 puff Inhalation TID  . [EXPIRED] enoxaparin      . enoxaparin (LOVENOX) injection  1 mg/kg Subcutaneous Q12H  . [COMPLETED] enoxaparin  70 mg Subcutaneous Once  . famotidine  20 mg Oral BID  . furosemide  30 mg Oral BID  . [COMPLETED] morphine  4 mg Intravenous Once  . nicotine  21 mg Transdermal Daily  . potassium chloride  20 mEq Oral BID  . senna  1 tablet Oral QHS  . [COMPLETED] sodium chloride      . [  DISCONTINUED] enoxaparin (LOVENOX) injection  1 mg/kg Subcutaneous Q12H   Continuous:   . 0.9 % NaCl with KCl 20 mEq / L 20 mL/hr at 01/29/12 1854   ZOX:[id number], acetaminophen, alum & mag hydroxide-simeth, [COMPLETED] iohexol, methocarbamol, morphine injection, ondansetron (ZOFRAN) IV, ondansetron, oxyCODONE  Assessment: Active Problems:  Bilateral lower extremity edema  Joint pain of lower extremity  Muscle spasm of right leg  Tobacco abuse  COPD (chronic obstructive pulmonary disease) with chronic bronchitis  Microcytic anemia  Extensive tattoos  Hypoalbuminemia  Emphysema     1. Bilateral lower extremity edema. Surprisingly, the lower extremity venous ultrasound was negative for bilateral lower extremity DVT. CT angiogram of the chest was negative for PE. We'll investigate further with a 2-D echocardiogram to assess for right heart dysfunction and a CT of the abdomen and pelvis to assess for venous outflow obstruction. The edema could very well be  secondary to NSAIDs. Nevertheless, there is less edema following oral Lasix.  Bilateral joint pain of the lower extremities. This may be secondary to cramping or degenerative joint disease or reported diagnosis of rheumatoid arthritis. We'll continue to treat with opiate analgesics and antispasmodic medications.  Microcytic anemia. Anemia panel results are pending. Hemoccult stool results pending.  Hypoalbuminemia. Etiology unknown at this time. We'll consult the registered dietitian for further evaluation and recommendations.  COPD/emphysema/tobacco abuse. Will continue albuterol inhaler and nicotine patch. The patient was advised to stop smoking.   Plan: 1. We'll order a CT of the abdomen and pelvis with oral contrast to rule out extrinsic obstruction. 2. Followup on the results of the 2-D echocardiogram, TSH, and anemia panel. 3. Await PT evaluation. 4. Nutrition consult. 5. Apparently the patient received 70 mg of Lovenox plus an additional 75 mg of Lovenox within a few hours of being admitted. This was noted. There appears to be no bleeding sequelae. Will stop Lovenox in light of absence of DVT radiographically and sonographically.      LOS: 1 day   Lonnetta Kniskern 01/30/2012, 10:44 AM

## 2012-01-30 NOTE — Progress Notes (Signed)
*  PRELIMINARY RESULTS* Echocardiogram 2D Echocardiogram has been performed.  Joe Mccullough 01/30/2012, 10:31 AM

## 2012-01-30 NOTE — Plan of Care (Signed)
Problem: Phase III Progression Outcomes Goal: Discharge plan remains appropriate-arrangements made Outcome: Completed/Met Date Met:  01/30/12 Plans to return home at discharge.

## 2012-01-30 NOTE — Progress Notes (Signed)
UR Chart Review Completed  

## 2012-01-30 NOTE — Progress Notes (Signed)
While receiving report I was told the patient received 70 mg of Lovenox on the ED and another 65mg  on the unit after being admitted.  Upon investigating Pt received Lovenox 70 mg at 1615 and 65 mg at 1700.  This dosing was more than 1 mg/kg Q 12 hrs as ordered.  I called Mindy at [facility] pharmacy and asked if there were any special precautions before call the MD and she stated that the next dose should not be given until 24 hours later.  I called Dr. Orvan Falconer and made him aware and he stated pharmacy should be doing this and to follow their recommendations.  I spoke with Clyde Canterbury, [facility] about the above and  placed on order for pharmacy consult for Lovenox.  Benny fixed the order to reflect the next dose to be due at 1700 on 01/30/12.  There is no evidence of excessive bleeding or bruising at this time.  Nursing staff to continue to monitor.

## 2012-01-30 NOTE — Plan of Care (Signed)
Problem: Phase III Progression Outcomes Goal: Voiding independently Outcome: Completed/Met Date Met:  01/30/12 Pt uses urinal.

## 2012-01-30 NOTE — Progress Notes (Signed)
Attempted to see pt for eval..he was c/o severe LE pain and was unable to work with me.   RN was alerted to pt's pain and he will be medicated.  Will try again a bit later.

## 2012-01-30 NOTE — Evaluation (Signed)
Physical Therapy Evaluation Patient Details Name: Joe Mccullough. MRN: [id number] DOB: 1955-05-22 Today's Date: 01/30/2012 Time: 4098-1191 PT Time Calculation (min): 34 min  PT Assessment / Plan / Recommendation Clinical Impression  Pt was seen for eval.  He had to be medicated for pain prior to my arrival.  He holds both hips and knees in full flexion and has significant spasm of R hamstrings.  He was able to gently stretch the L hip and knee into almost full extension, but it wasn't until after I had applied 30 min of moist heat to the R hamstrings that we were able to achieve significant extension of that leg  ( -25 deg, AA).  His pain had subsided, so he was agreeable to ambulate with a walker.  He maintains his hips and knees in flexion during the entire gait cycle, but is surprisingly stable with this.  He reports that he sometimes ambulates this way at home.  He is able to walk with no assistive device, But for now i think he is safer with a walker.  He lives alone in a mobile home and very little support structure.  He is very concerned about getting back to work (he is currently on "light duty").  He would benefit from more PT, especially OP, but he has no transportation and says that he cannot leave work for PT.  Heat really did help to ease his muscle spasm, so MD might want to order a Heating pad to use intermittently through the day.    PT Assessment  Patient needs continued PT services    Follow Up Recommendations  Outpatient PT    Does the patient have the potential to tolerate intense rehabilitation      Barriers to Discharge Decreased caregiver support has 5 steps to front door    Equipment Recommendations  Rolling walker with 5" wheels    Recommendations for Other Services     Frequency Min 3X/week    Precautions / Restrictions Precautions Precautions: Fall Restrictions Weight Bearing Restrictions: No   Pertinent Vitals/Pain       Mobility  Bed  Mobility Bed Mobility: Supine to Sit;Sit to Supine Supine to Sit: 6: Modified independent (Device/Increase time) Sit to Supine: 6: Modified independent (Device/Increase time) Transfers Transfers: Sit to Stand;Stand to Sit Sit to Stand: 5: Supervision;With upper extremity assist;From bed Stand to Sit: 6: Modified independent (Device/Increase time);With upper extremity assist;To chair/3-in-1;To bed Ambulation/Gait Ambulation/Gait Assistance: 5: Supervision Ambulation Distance (Feet): 400 Feet Assistive device: Rolling walker Ambulation/Gait Assistance Details: pt is able to walk a significant distance with both knees in about 35 deg of flexion.  This would normally be very unstable, but he seems to tolerate it amazingly well and had no instability.  He is able to walk without assistive device, but this is unsafe in my mind Gait Pattern: Right flexed knee in stance;Left flexed knee in stance;Trunk flexed Gait velocity: tends to walk rapidly Stairs: No Wheelchair Mobility Wheelchair Mobility: No    Shoulder Instructions     Exercises General Exercises - Lower Extremity Ankle Circles/Pumps: AROM;Both;10 reps;Supine Quad Sets: AROM;Both;10 reps;Supine Other Exercises Other Exercises: active hip/knee extension with gentle traction Other Exercises: contract/relax to hamstrings R for increased relaxation of hamstrings.   PT Diagnosis: Difficulty walking;Abnormality of gait;Acute pain  PT Problem List: Decreased range of motion;Pain;Decreased knowledge of use of DME PT Treatment Interventions: Gait training;Stair training;Therapeutic exercise   PT Goals Acute Rehab PT Goals PT Goal Formulation: With patient Time For Goal  Achievement: 02/13/12 Potential to Achieve Goals: Good Pt will Go Up / Down Stairs: 3-5 stairs;with supervision;with rail(s) PT Goal: Up/Down Stairs - Progress: Goal set today Pt will Perform Home Exercise Program: Independently Additional Goals Additional Goal #1:  achieve full extension of both knees PT Goal: Additional Goal #1 - Progress: Goal set today  Visit Information  Last PT Received On: 01/30/12    Subjective Data  Subjective: I'm worried about my job.Marland KitchenMarland KitchenI never miss a day Patient Stated Goal: wants to get back to work   Prior Functioning  Home Living Lives With: Alone Available Help at Discharge: Friend(s);Available PRN/intermittently Type of Home: Mobile home Home Access: Stairs to enter Entrance Stairs-Number of Steps: 5 Entrance Stairs-Rails: None Home Layout: One level Firefighter: Standard Home Adaptive Equipment: None Prior Function Level of Independence: Independent Able to Take Stairs?: Yes Driving: No Vocation: Full time employment Communication Communication: No difficulties    Cognition  Overall Cognitive Status: Appears within functional limits for tasks assessed/performed Arousal/Alertness: Awake/alert Orientation Level: Appears intact for tasks assessed Behavior During Session: [facility] for tasks performed    Extremity/Trunk Assessment Right Lower Extremity Assessment RLE ROM/Strength/Tone: Deficits RLE ROM/Strength/Tone Deficits: Pt holds hip and knee in full flexion...only able to extend knee  -60 deg with significant pain...hamstrings are maximally contracted RLE Sensation: WFL - Light Touch Left Lower Extremity Assessment LLE ROM/Strength/Tone: Deficits LLE ROM/Strength/Tone Deficits: pt holds hip and knee fully flexed but is able to gently stretch to -20 deg  extension LLE Sensation: WFL - Light Touch Trunk Assessment Trunk Assessment: Kyphotic   Balance Balance Balance Assessed: No (WNL by observation)  End of Session PT - End of Session Equipment Utilized During Treatment: Gait belt Activity Tolerance: Patient tolerated treatment well Patient left: in chair;with call bell/phone within reach Nurse Communication: Mobility status  GP     Konrad Penta 01/30/2012, 1:24 PM

## 2012-01-30 NOTE — Plan of Care (Signed)
Problem: Phase II Progression Outcomes Goal: Progress activity as tolerated unless otherwise ordered Outcome: Completed/Met Date Met:  01/30/12 Pt up to chair and ambulated with PT today.

## 2012-01-30 NOTE — Care Management Note (Unsigned)
    Page 1 of 2   01/31/2012     3:55:46 PM   CARE MANAGEMENT NOTE 01/31/2012  Patient:  Joe Mccullough, Joe Mccullough   Account Number:  [id number]  Date Initiated:  01/30/2012  Documentation initiated by:  Rosemary Holms  Subjective/Objective Assessment:   Pt admitted from home where he lives alone. Pt states he has no support but states a friend did drive him to the hospital. Pt eager to RTW.     Action/Plan:   DC home. Per PT, could benefit from  outpt PT and rolling walker. Barrier-no transportation.   Anticipated DC Date:  01/31/2012   Anticipated DC Plan:  HOME/SELF CARE  In-house referral  Clinical Social Worker      DC Planning Services  CM consult      Choice offered to / List presented to:     DME arranged  WALKER - Lavone Nian      DME agency  [facility]        Status of service:  Completed, signed off Medicare Important Message given?   (If response is "NO", the following Medicare IM given date fields will be blank) Date Medicare IM given:   Date Additional Medicare IM given:    Discharge Disposition:  HOME/SELF CARE  Per UR Regulation:    If discussed at Long Length of Stay Meetings, dates discussed:    Comments:  01/31/12 Rosemary Holms RN BSN CM [employer] approved [facility] to supply rolling walker. [facility] refused to provide service. CM called [facility] and they will deliver ASAP to hospital prior to 5pm so pt can get a ride via taxi home. CSW working with taxi service regarding cost and timing.  01/30/12 Rosemary Holms RN BSN CM

## 2012-01-30 NOTE — Progress Notes (Signed)
INITIAL ADULT NUTRITION ASSESSMENT Date: 01/30/2012   Time: 3:08 PM Reason for Assessment: Nutritional Status/Requirements  ASSESSMENT: Male 56 y.o.  Dx includes: Bilateral LE Edema, LE Joint pain, Microcytic Anemia, Tobacco Abuse, COPD, Hypoalbuminemia  Past Medical History  Diagnosis Date  . Rheumatoid arthritis   . COPD (chronic obstructive pulmonary disease)   . Tobacco abuse   . Emphysema 01/30/2012    Scheduled Meds:   . albuterol  2 puff Inhalation TID  . [COMPLETED] enoxaparin  70 mg Subcutaneous Once  . famotidine  20 mg Oral BID  . furosemide  30 mg Oral BID  . [COMPLETED] morphine  4 mg Intravenous Once  . nicotine  21 mg Transdermal Daily  . potassium chloride  20 mEq Oral BID  . senna  1 tablet Oral QHS  . [COMPLETED] sodium chloride      . [DISCONTINUED] enoxaparin (LOVENOX) injection  1 mg/kg Subcutaneous Q12H  . [DISCONTINUED] enoxaparin (LOVENOX) injection  1 mg/kg Subcutaneous Q12H   Continuous Infusions:   . 0.9 % NaCl with KCl 20 mEq / L 20 mL/hr at 01/29/12 1854   PRN Meds:.acetaminophen, acetaminophen, alum & mag hydroxide-simeth, [COMPLETED] iohexol, methocarbamol, morphine injection, ondansetron (ZOFRAN) IV, ondansetron, oxyCODONE  Ht: 5\' 8"  (172.7 cm)  Wt: 139 lb 1.8 oz (63.1 kg)  Ideal Wt:  68.4 kg % Ideal Wt: 93%  Usual Wt:  Wt Readings from Last 10 Encounters:  01/30/12 139 lb 1.8 oz (63.1 kg)     Body mass index is 21.15 kg/(m^2). Normal range   Food/Nutrition Related Hx: Pt reports good appetite and po intake. He ate 100% of breakfast and lunch. Regular diet, meal pattern is low fat and minimum processed foods: usually cereal for breakfast during the week and eggs on weekend, has chicken or tuna salad for lunch sometimes cereal again at night. He doesn't eat fast food and limits both beef and fried foods r/t past gout flare-up. Takes daily MVI. His dentition is poor. Pt is also a smoker. Bilateral edema and hypoalbuminemia at  admission. Inflammation may be contributing to decreased albumin.We discussed the importance of adequate protein intake and identified foods he commonly eats that contain protein. He is amiable to taking protein supplement to help ensure his estimated needs are met.   Assessment:Question adequacy of his nutritional intake at home even though his appetite is currently good. Question if he has adequate food resources. He is at risk for the following nutritional deficiencies r/t Rheumatoid Arthritis dx: Folic acid (particularly if on methotrexate)  Vitamins C, D, B-6, B-12, and E  Calcium  Magnesium  Zinc  Selenium   CMP     Component Value Date/Time   NA 134* 01/30/2012 0456   K 4.0 01/30/2012 0456   CL 99 01/30/2012 0456   CO2 28 01/30/2012 0456   GLUCOSE 100* 01/30/2012 0456   BUN 9 01/30/2012 0456   CREATININE 0.60 01/30/2012 0456   CALCIUM 8.6 01/30/2012 0456   PROT 6.8 01/30/2012 0456   ALBUMIN 1.9* 01/30/2012 0456   AST 12 01/30/2012 0456   ALT 9 01/30/2012 0456   ALKPHOS 108 01/30/2012 0456   BILITOT 0.1* 01/30/2012 0456   GFRNONAA >90 01/30/2012 0456   GFRAA >90 01/30/2012 0456    Intake/Output Summary (Last 24 hours) at 01/30/12 1529 Last data filed at 01/30/12 0800  Gross per 24 hour  Intake    120 ml  Output    900 ml  Net   -780 ml  Diet Order: General Regular diet 100% po's  Supplements/Tube Feeding:none at this time  IVF:    0.9 % NaCl with KCl 20 mEq / L Last Rate: 20 mL/hr at 01/29/12 1854    Estimated Nutritional Needs:   Kcal:1900-2205 kcal Protein:95-107 gr Fluid:1 ml/kcal  NUTRITION DIAGNOSIS: -Predicted suboptimal protein-energy intake (NI-1.6).  Status: Ongoing  RELATED TO: bilateral edema LE, rheumatoidarthritis  AS EVIDENCE BY: joint pain knees and hip, LE edema feet  MONITORING/EVALUATION(Goals): Monitor po meal and supplement intake Goal: Pt to meet >/= 90% of their estimated nutrition needs; not met  EDUCATION NEEDS: -Education needs  addressed r/t protein-energy intake   INTERVENTION: -Add ProStat 30 ml TID between meals -MVI daily  Dietitian [phone]  DOCUMENTATION CODES Per approved criteria  -Not Applicable    Francene Boyers 01/30/2012, 3:08 PM

## 2012-01-30 NOTE — Plan of Care (Signed)
Problem: Phase I Progression Outcomes Goal: OOB as tolerated unless otherwise ordered Outcome: Completed/Met Date Met:  01/30/12 Up to chair today and ambulated to nurses' station using walker and back with PT.

## 2012-01-31 ENCOUNTER — Encounter (HOSPITAL_COMMUNITY): Payer: Self-pay | Admitting: Internal Medicine

## 2012-01-31 DIAGNOSIS — R911 Solitary pulmonary nodule: Secondary | ICD-10-CM

## 2012-01-31 DIAGNOSIS — R590 Localized enlarged lymph nodes: Secondary | ICD-10-CM

## 2012-01-31 DIAGNOSIS — G9340 Encephalopathy, unspecified: Secondary | ICD-10-CM | POA: Diagnosis not present

## 2012-01-31 HISTORY — DX: Localized enlarged lymph nodes: R59.0

## 2012-01-31 HISTORY — DX: Solitary pulmonary nodule: R91.1

## 2012-01-31 LAB — BASIC METABOLIC PANEL
Calcium: 9 mg/dL (ref 8.4–10.5)
Creatinine, Ser: 0.6 mg/dL (ref 0.50–1.35)
GFR calc Af Amer: >90 mL/min (ref >90)

## 2012-01-31 MED ORDER — ADULT MULTIVITAMIN W/MINERALS CH: 1.0000 | ORAL_TABLET | Freq: Every day | ORAL | Status: DC

## 2012-01-31 MED ORDER — THIAMINE HCL 100 MG/ML IJ SOLN
100.0000 mg | Freq: Every day | INTRAMUSCULAR | Status: AC
  Administered 2012-01-31: 100 mg via INTRAVENOUS
  Filled 2012-01-31: qty 2

## 2012-01-31 MED ORDER — POTASSIUM CHLORIDE CRYS ER 10 MEQ PO TBCR: 10.0000 meq | EXTENDED_RELEASE_TABLET | Freq: Every day | ORAL | Status: DC

## 2012-01-31 MED ORDER — PREDNISONE 10 MG PO TABS
50.0000 mg | ORAL_TABLET | Freq: Once | ORAL | Status: AC
  Administered 2012-01-31: 50 mg via ORAL
  Filled 2012-01-31: qty 2

## 2012-01-31 MED ORDER — DICLOFENAC SODIUM 1 % TD GEL: 2.0000 g | Freq: Three times a day (TID) | TRANSDERMAL | Status: DC

## 2012-01-31 MED ORDER — FUROSEMIDE 20 MG PO TABS: 20.0000 mg | ORAL_TABLET | Freq: Every day | ORAL | Status: DC

## 2012-01-31 MED ORDER — FERROUS SULFATE 325 (65 FE) MG PO TABS
325.0000 mg | ORAL_TABLET | Freq: Every day | ORAL | Status: DC
  Administered 2012-01-31: 325 mg via ORAL
  Filled 2012-01-31: qty 1

## 2012-01-31 MED ORDER — ALBUTEROL SULFATE HFA 108 (90 BASE) MCG/ACT IN AERS: 2.0000 | INHALATION_SPRAY | Freq: Three times a day (TID) | RESPIRATORY_TRACT | Status: DC

## 2012-01-31 MED ORDER — ACETAMINOPHEN 325 MG PO TABS: 650.0000 mg | ORAL_TABLET | Freq: Four times a day (QID) | ORAL | Status: DC | PRN

## 2012-01-31 MED ORDER — FERROUS SULFATE 325 (65 FE) MG PO TABS: 325.0000 mg | ORAL_TABLET | Freq: Every day | ORAL | Status: DC

## 2012-01-31 MED ORDER — PREDNISONE 10 MG PO TABS: ORAL_TABLET | ORAL | Status: DC

## 2012-01-31 MED ORDER — PANTOPRAZOLE SODIUM 40 MG PO TBEC: 40.0000 mg | DELAYED_RELEASE_TABLET | Freq: Every day | ORAL | Status: DC

## 2012-01-31 MED ORDER — CYANOCOBALAMIN 1000 MCG/ML IJ SOLN
1000.0000 ug | Freq: Once | INTRAMUSCULAR | Status: AC
  Administered 2012-01-31: 1000 ug via INTRAMUSCULAR
  Filled 2012-01-31: qty 1

## 2012-01-31 MED ORDER — DICLOFENAC SODIUM 1 % TD GEL
2.0000 g | Freq: Four times a day (QID) | TRANSDERMAL | Status: DC
  Administered 2012-01-31: 2 g via TOPICAL
  Filled 2012-01-31: qty 100

## 2012-01-31 NOTE — Progress Notes (Signed)
Patient discharged home via cab paid by hospital.  Approval obtained by SW.  Patient instructed on new meds and how to take them.  Verbalizes understanding.  Follow up appointment in place.  Patient instructed and given educational info on smoking cessation.  Patient instructed not to return to work until cleared by PCP on Thursday. IV removed - WNL.  Stable to discahrge

## 2012-01-31 NOTE — Clinical Social Work Note (Signed)
CSW arranged transport home for pt with [employer]. Pt states he has no money. Transport of $30 approved by [employer]. Pt to be picked up at 5:30 at main entrance.  Derenda Fennel, [place] [phone]

## 2012-01-31 NOTE — Discharge Summary (Signed)
Physician Discharge Summary  Joe Mccullough. WUJ:[id number] DOB: October 12, 1955 DOA: 01/29/2012  PCP: Colette Ribas, MD  Admit date: 01/29/2012 Discharge date: 01/31/2012  Time spent: Greater than 30 minutes  Recommendations for Outpatient Follow-up:  1. The patient was discharged to home in improved condition. He will followup in 2 days with PA Mr. Justin Mend.  Discharge Diagnoses:  1. Bilateral lower extremity edema, thought to be NSAID induced. Bilateral lower extremity venous ultrasound negative for DVT. CT angiogram negative for PE. 2-D echocardiogram revealed preserved LV and RV systolic function. CT abdomen revealed no extrinsic venous obstruction. 2. Rheumatoid arthritis with chronic arthritic pain of his legs.. 3. 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 4. Hypoalbuminemia. 5. Microcytic anemia. GI outpatient referral recommended for further evaluation recommended. 6. Acute encephalopathy, secondary to IV morphine. 7. Extensive tattoos. Liver transaminases within normal limits. 8. Tobacco abuse. He was advised to stop smoking. 9. Mild bilateral hilar adenopathy. 10. 4 mm left lower lobe lung nodule. Followup CT recommended in 1 year.    Discharge Condition: Improved.  Diet recommendation: Heart healthy.  Filed Weights   01/29/12 1802 01/30/12 0428 01/31/12 0632  Weight: 65.69 kg (144 lb 13.1 oz) 63.1 kg (139 lb 1.8 oz) 57.5 kg (126 lb 12.2 oz)    History of present illness:  The patient is a 56 year old man with a history significant for COPD and rheumatoid arthritis, who presented to the emergency department on 01/29/2012 with a chief complaint of extensive swelling of both of his legs. He also complained of right greater than left leg pain. He had been started on Lasix for swelling a few months ago. He was taking both generic Relafen and Celebrex as well as methotrexate for joint pain. In the emergency department, he was afebrile and  hemodynamically stable. His chest x-ray revealed COPD and chronic bronchitis. His lab data were significant for a hemoglobin of 10.9, MCV of 76.7, albumin of 2.0, and a d-dimer of greater than 20. He was admitted for further evaluation and management.  Hospital Course:  In the setting of impressive bilateral lower extremity edema and a d-dimer of greater than 20, he was presumed to have bilateral lower extremity DVT until proven otherwise. Therefore, full dose Lovenox was started empirically until the venous Doppler/ultrasound was available for evaluation. All of his NSAIDs were discontinued. Twice a day dosing of Lasix was initiated. His pain was treated with as needed morphine or oxycodone. His spasmodic symptoms were treated with as needed Robaxin. A nicotine patch was placed for tobacco replacement therapy. Albuterol inhaler was given for mild bronchospasms on exam. Tobacco cessation counseling was ordered. He was strongly advised to stop smoking. Given his microcytic anemia, H2 blockade therapy was started with Pepcid. For further evaluation, a number of studies were ordered. CT angiogram of the chest revealed emphysema, enlarged bilateral hilar lymph nodes, but no evidence for pulmonary embolism. Bilateral lower extremity venous ultrasound revealed no evidence for deep vein thrombosis. CT of the abdomen and pelvis with oral contrast only revealed no mass, adenopathy, or other lesion to explain his lower extremity edema, but he did reveal left nephrolithiasis and a 4 mm left lower lobe nodule. His 2-D echocardiogram revealed preserved LV function with an ejection fraction of 65%. Right systolic function was within normal limits. His TSH was within normal limits at 3.4 and his free T4 was within normal limits at 0.95. His pro BNP was only 223. His sedimentation rate was 125  His anemia panel  revealed a total iron of 12, TIBC of 167, percent saturation of 7, ferritin of 235, and vitamin B12 of 285. He was  started on ferrous sulfate once daily. Hemoccult his stools were ordered, but none were resulted.  The patient developed acute delirium/encephalopathy. There was a temporal relationship between his symptomatology and being given IV morphine. For evaluation, a CT scan of his head was ordered. It was negative for acute changes. An ammonia level was ordered as well and it was within normal limits. He was given 1 emperic dose of IM vitamin B 12 and IV thiamine. He strongly denied alcohol abuse or use; he was consistent in telling the medical staff that he had not drunk alcohol in over a year. All opiate medications including Roxicodone and morphine was discontinued. Robaxin was discontinued as well. His encephalopathy resolved. He was eventually started on a prednisone taper and topical Voltaren for pain in his knees.  The physical therapist evaluated him. She recommended a rolling walker with 5 inch wheels. This was ordered. She recommended outpatient physical therapy.  The patient's lower extremity edema decreased from 4+ to trace to 1+. The 30 mg twice a day dosing of Lasix and the discontinuation of NSAIDs, appear to have been effective. I discussed the patient with PA, Mr. Nelva Bush at [facility]. Apparently, the patient was diagnosed with rheumatoid arthritis in May of 2013. He had a very high rheumatoid factor titer. At that time, he was started on methotrexate and intermittent doses of Celebrex and Relafen. He had been subsequently started on Lasix for edema. He was referred to a rheumatologist, but apparently the patient did not keep the appointment. I recommended that the patient be evaluated by a gastroenterologist in the outpatient setting for his microcytic anemia. Mr. Nelva Bush agreed. I recommended that Celebrex and Relafen remain discontinued, but restarting one or the other would not be totally contraindicated if he was maintained on Lasix. Protonix was prescribed at the time of discharge for GI  prophylaxis on prednisone and previous NSAIDs.        Procedures: 2-D echocardiogram on 01/30/2012:History: PMH: Chronic obstructive pulmonary disease. PMH: Bilateral lower leg edema, emphysema Risk factors: Current tobacco use.  ------------------------------------------------------------ Study Conclusions  Left ventricle: The cavity size was normal. Wall thickness was normal. Systolic function was vigorous. The estimated ejection fraction was in the range of 65% to 70%. Left ventricular diastolic function parameters were normal. Transthoracic echocardiography. M-mode, complete 2D, spectral Doppler, and color Doppler. Height: Height: 172.7cm. Height: 68in. Weight: Weight: 63.1kg. Weight: 138.7lb. Body mass index: BMI: 21.1kg/m^2. Body surface area: BSA: 1.24m^2. Patient status: Inpatient. Location: Bedside.      Consultations:  None  Discharge Exam: Filed Vitals:   01/30/12 2152 01/31/12 0632 01/31/12 0645 01/31/12 1321  BP: 130/75 97/54    Pulse: 101 93    Temp: 98.7 F (37.1 C) 98.2 F (36.8 C)    TempSrc: Oral Oral    Resp: 18 18    Height:      Weight:  57.5 kg (126 lb 12.2 oz)    SpO2: 97% 94% 96% 95%    General: No acute distress. He is alert, sitting up in bed. Cardiovascular: S1, S2, with no murmurs rubs or gallops. Respiratory: Decreased breath sounds in the bases with resolution of wheezes/crackles. Neurologic: He is alert and oriented x3. Cranial nerves II through XII are grossly intact.   Discharge Instructions      Discharge Orders    Future Orders Please Complete By Expires  Diet - low sodium heart healthy      Increase activity slowly      Discharge instructions      Comments:   To not go back to work until your reevaluated by your primary care provider. Stop smoking. Take medications as prescribed. Do not take Celebrex or nabumetone.       Medication List     As of 01/31/2012  2:47 PM    STOP taking these medications           nabumetone 500 MG tablet   Commonly known as: RELAFEN      TAKE these medications         acetaminophen 325 MG tablet   Commonly known as: TYLENOL   Take 2 tablets (650 mg total) by mouth every 6 (six) hours as needed for pain.      albuterol 108 (90 BASE) MCG/ACT inhaler   Commonly known as: PROVENTIL HFA;VENTOLIN HFA   Inhale 2 puffs into the lungs 3 (three) times daily.      diclofenac sodium 1 % Gel   Commonly known as: VOLTAREN   Apply 2 g topically 3 (three) times daily.      ferrous sulfate 325 (65 FE) MG tablet   Take 1 tablet (325 mg total) by mouth daily with breakfast. Iron supplement to treat your anemia.      furosemide 20 MG tablet   Commonly known as: LASIX   Take 1 tablet (20 mg total) by mouth daily. Take daily for swelling in your legs.      methotrexate 2.5 MG tablet   Take 7.5 mg by mouth once a week.      multivitamin with minerals Tabs   Take 1 tablet by mouth daily.      pantoprazole 40 MG tablet   Commonly known as: PROTONIX   Take 1 tablet (40 mg total) by mouth daily. Medicine to protect your stomach lining from bleeding.      potassium chloride 10 MEQ tablet   Commonly known as: K-DUR,KLOR-CON   Take 1 tablet (10 mEq total) by mouth daily. Take with furosemide (Lasix).      predniSONE 10 MG tablet   Commonly known as: DELTASONE   For treatment of your rheumatoid arthritis and pain: Starting tomorrow, take 6 tablets for 1 day; then 5 tablets next day; then 4 tablets the next day; then 3 tablets next day; then 2 tablets next day; then 1 tablet the next day; then stop.        Follow-up Information    Follow up with [facility]. On 02/02/2012. (11:00 am)    Contact information:   [address] [place] [facility] [id number] [phone]  You will see Christiane Ha - the PA          The results of significant diagnostics from this hospitalization (including imaging, microbiology, ancillary and laboratory)  are listed below for reference.    Significant Diagnostic Studies: Ct Abdomen Pelvis Wo Contrast  01/30/2012  *RADIOLOGY REPORT*  Clinical Data: Lower extremity edema  CT ABDOMEN AND PELVIS WITHOUT CONTRAST  Technique:  Multidetector CT imaging of the abdomen and pelvis was performed following the standard protocol without intravenous contrast.  Comparison: None.  Findings: 4 mm nodule in the visualized   left lower lobe image 1/43.  No pleural or pericardial effusion.  Unremarkable uninfused evaluation of liver, nondistended gallbladder, spleen and accessory splenule, adrenal glands, pancreas.  Patchy aortoiliac arterial calcifications without aneurysm.  3 mm calculus in the lower pole of the left renal collecting system.  No hydronephrosis.  No ureterectasis or ureteral calculus.  Urinary bladder is incompletely distended, unremarkable.  Mild prostatic enlargement with coarse central calcifications. Urinary bladder is incompletely distended.  The small bowel is nondilated. Normal appendix.  The colon is incompletely distended, unremarkable.  No ascites. Bilateral inguinal hernia repair without evidence of recurrence. No ascites.  No free air.  Sub centimeter bilateral common and external iliac chain lymph nodes.  Sub centimeter left para-aortic and aortocaval lymph nodes. No mesenteric adenopathy.  Lumbar spine unremarkable.  IMPRESSION:  1.  Negative for mass, adenopathy, or other lesion to explain lower extremity edema. 2.  Left nephrolithiasis without hydronephrosis. 3.  4 mm left lower lobe pulmonary nodule. If the patient is at high risk for bronchogenic carcinoma, follow-up chest CT at 1 year is recommended.  If the patient is at low risk, no follow-up is needed.  This recommendation follows the consensus statement: Guidelines for Management of Small Pulmonary Nodules Detected on CT Scans:  A Statement from the Fleischner Society as published in Radiology 2005; 237:395-400.   Original Report  Authenticated By: D. Andria Rhein, MD    Dg Chest 2 View  01/29/2012  *RADIOLOGY REPORT*  Clinical Data: Bilateral leg swelling.  CHEST - 2 VIEW  Comparison: None.  Findings: Normal sized heart.  Clear lungs.  The lungs are mildly hyperexpanded with mildly prominent interstitial markings and mild diffuse peribronchial thickening.  Left nipple shadow.  Minimal thoracic spine degenerative changes.  IMPRESSION: No acute abnormality.  Mild changes of COPD and chronic bronchitis.   Original Report Authenticated By: Beckie Salts, M.D.    Ct Head Wo Contrast  01/30/2012  *RADIOLOGY REPORT*  Clinical Data: Acute mental status changes with increased confusion.  CT HEAD WITHOUT CONTRAST  Technique:  Contiguous axial images were obtained from the base of the skull through the vertex without contrast.  Comparison: None.  Findings: Ventricular system normal in size and appearance for age. Borderline cortical atrophy.  No mass lesion.  No midline shift. No acute hemorrhage or hematoma.  No extra-axial fluid collections. No evidence of acute infarction.  No focal brain parenchymal abnormality.  No skull fracture or other focal osseous abnormality involving the skull.  Visualized paranasal sinuses, bilateral mastoid air cells, and bilateral middle ear cavities well-aerated.  Severe bony nasal septal deviation to the right.  Left carotid siphon atherosclerosis.  IMPRESSION: No acute intracranial abnormality.   Original Report Authenticated By: Hulan Saas, M.D.    Ct Angio Chest Pe W/cm &/or Wo Cm  01/29/2012  *RADIOLOGY REPORT*  Clinical Data: Elevated D-dimer.  Bilateral lower leg edema.  CT ANGIOGRAPHY CHEST  Technique:  Multidetector CT imaging of the chest using the standard protocol during bolus administration of intravenous contrast. Multiplanar reconstructed images including MIPs were obtained and reviewed to evaluate the vascular anatomy.  Contrast: OMNIPAQUE IOHEXOL 350 MG/ML SOLN  Comparison: None.   Findings: Lungs/pleura: No pleural effusion.  There are mild changes of centrilobular emphysema.  Dependent changes noted in the lung bases posteriorly.  No airspace consolidation.  No suspicious nodule identified.  Small granuloma is identified in the left upper lobe.  Heart/Mediastinum: Normal heart size.  No pericardial effusion. Prominent bilateral hilar lymph nodes.  The left hilar lymph node measures 1.8 cm, image 42.  Right hilar lymph node measures 1.1 cm. The pulmonary arteries appear patent without evidence for acute pulmonary embolus.  Upper abdomen: No acute  findings identified.  No mass or adenopathy.  Bones/Musculoskeletal:  No axillary adenopathy identified.  Review of the visualized osseous structures is negative for acute bony abnormalities.  No aggressive lytic or sclerotic bone lesions identified.  IMPRESSION: 1.  No acute findings.  No evidence for pulmonary embolus. 2.  Emphysema 3.  Enlarged bilateral hilar lymph nodes.   Original Report Authenticated By: Signa Kell, M.D.    US Venous Img Lower Bilateral  01/30/2012  *RADIOLOGY REPORT*  Clinical Data: Bilateral lower extremity edema  BILATERAL LOWER EXTREMITY VENOUS DUPLEX ULTRASOUND  Technique:  Gray-scale sonography with graded compression, as well as color Doppler and duplex ultrasound, were performed to evaluate the deep venous system of both lower extremities from the level of the common femoral vein through the popliteal and proximal calf veins.  Spectral Doppler was utilized to evaluate flow at rest and with distal augmentation maneuvers.  Comparison:  None.  Findings:  Normal compressibility of bilateral common femoral, superficial femoral, and popliteal veins is demonstrated, as well as the visualized proximal calf veins.  No filling defects to suggest DVT on grayscale or color Doppler imaging.  Doppler waveforms show normal direction of venous flow, normal respiratory phasicity and response to augmentation.  IMPRESSION: No  evidence of deep vein thrombosis in either lower extremity.   Original Report Authenticated By: Malachy Moan, M.D.     Microbiology: No results found for this or any previous visit (from the past 240 hour(s)).   Labs: Basic Metabolic Panel:  Lab 01/31/12 1610 01/30/12 0456 01/29/12 1425  NA 134* 134* 134*  K 4.3 4.0 3.9  CL 96 99 99  CO2 29 28 27   GLUCOSE 98 100* 92  BUN 11 9 6   CREATININE 0.60 0.60 0.43*  CALCIUM 9.0 8.6 8.8  MG -- -- --  PHOS -- -- --   Liver Function Tests:  Lab 01/30/12 0456 01/29/12 1425  AST 12 14  ALT 9 11  ALKPHOS 108 119*  BILITOT 0.1* 0.2*  PROT 6.8 7.1  ALBUMIN 1.9* 2.0*   No results found for this basename: LIPASE:5,AMYLASE:5 in the last 168 hours  Lab 01/30/12 2211  AMMONIA 15   CBC:  Lab 01/30/12 0456 01/29/12 1425  WBC 6.8 6.5  NEUTROABS -- 4.4  HGB 10.0* 10.9*  HCT 31.8* 34.0*  MCV 77.0* 76.7*  PLT 454* 407*   Cardiac Enzymes: No results found for this basename: CKTOTAL:5,CKMB:5,CKMBINDEX:5,TROPONINI:5 in the last 168 hours BNP: BNP (last 3 results)  Basename 01/29/12 1425  PROBNP 222.7*   CBG: No results found for this basename: GLUCAP:5 in the last 168 hours     Signed:  Jamiyla Ishee  [facility] 01/31/2012, 2:47 PM

## 2012-01-31 NOTE — Progress Notes (Signed)
At 0350, patient screamed out and a nurse tech went in and accidentally scared the patient because he could not understand why she was in there. Nurse went in because patient had said he was in pain. Patient was agitated and sweating. Patient could not tell the nurse his name or where he was. Doctor was notified and came up to the floor to assess him. No new orders were given.

## 2012-01-31 NOTE — Progress Notes (Signed)
Patient's tests came back normal for the CT of head and for the ammonia level. Patient was alert and oriented times 4. Patient stated he was in horrible pain in his knees, so RN gave the pain medicine. Will continue to monitor.

## 2012-01-31 NOTE — Progress Notes (Signed)
Patient became confused again. He went through the trash can and took out a used temperature probe and started smoking it like a cigarette. Patient was oriented to where he was. Last pain medicine given was at 0109. Will continue to monitor the patient.

## 2012-01-31 NOTE — Progress Notes (Signed)
During my assessment at 2141, patient could not tell me where he was or what the situation was. 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 Nurse went through a neurological assessment and everything was WNL. Doctor was notified and told that patient had not had pain medicine since 1700 and that patient told RN he had not had alcohol for over a year. Doctor ordered a CT of the patient's head and ammonia level. Will continue to monitor patient.

## 2012-01-31 NOTE — Progress Notes (Signed)
Physical Therapy Treatment Patient Details Name: Joe Mccullough. MRN: [id number] DOB: 11/08/1955 Today's Date: 01/31/2012 Time: 4098-1191 PT Time Calculation (min): 21 min 1 gt 1 therex  PT Assessment / Plan / Recommendation Comments on Treatment Session  Patient intially agitated/distracted from PT due to his concern of getting back home today. Attempts at stretching BLE made at EOB but patient was grimacing and yelling out in pain especially with the RLE. Over 200' of gait training with RW;supervison ( multiple VC's required for proper technique was required) was completed with patient's BLE/knee pain decreasing with increased activity.. After ambulation patient was able to participate in seated extension stretching of BLE with good results. Patient refused stairs due to fatigue.    Follow Up Recommendations        Does the patient have the potential to tolerate intense rehabilitation     Barriers to Discharge        Equipment Recommendations       Recommendations for Other Services    Frequency     Plan      Precautions / Restrictions     Pertinent Vitals/Pain     Mobility  Bed Mobility Supine to Sit: 6: Modified independent (Device/Increase time) Sit to Supine: 6: Modified independent (Device/Increase time) Transfers Sit to Stand: 4: Min guard;From elevated surface;With upper extremity assist Stand to Sit: 6: Modified independent (Device/Increase time) Ambulation/Gait Ambulation/Gait Assistance: 5: Supervision Ambulation Distance (Feet): 210 Feet Assistive device: Rolling walker Ambulation/Gait Assistance Details: pt is able to walk a significant distance with both knees in about 35 deg of flexion. This would normally be very unstable, but he seems to tolerate it amazingly well and had no instability. He is able to walk without assistive device, but this is unsafe in my mind     Exercises Other Exercises Other Exercises: hip/knee flexion stretch  30"x3;bilaterally-much better outcome of extension after ambulation   PT Diagnosis:    PT Problem List:   PT Treatment Interventions:     PT Goals    Visit Information  Last PT Received On: 01/31/12    Subjective Data      Cognition       Balance     End of Session PT - End of Session Equipment Utilized During Treatment: Gait belt Activity Tolerance: Patient tolerated treatment well Patient left: in bed;with bed alarm set;with call bell/phone within reach Nurse Communication: Mobility status   GP     Joe Mccullough ATKINSO 01/31/2012, 11:27 AM

## 2012-02-08 LAB — FOLATE: Folate: 6 ng/mL (ref >5.4)

## 2013-07-23 ENCOUNTER — Emergency Department (HOSPITAL_COMMUNITY): Payer: Worker's Compensation

## 2013-07-23 ENCOUNTER — Inpatient Hospital Stay (HOSPITAL_COMMUNITY)
Admission: EM | Admit: 2013-07-23 | Discharge: 2013-08-01 | DRG: 470 | Disposition: A | Discharge status: Skilled Nursing Facility | Payer: Worker's Compensation | Attending: Internal Medicine | Admitting: Internal Medicine

## 2013-07-23 ENCOUNTER — Emergency Department (HOSPITAL_COMMUNITY): Payer: 59 | Attending: Emergency Medicine

## 2013-07-23 ENCOUNTER — Encounter (HOSPITAL_COMMUNITY): Payer: Self-pay | Admitting: Emergency Medicine

## 2013-07-23 DIAGNOSIS — S72009A Fracture of unspecified part of neck of unspecified femur, initial encounter for closed fracture: Secondary | ICD-10-CM

## 2013-07-23 DIAGNOSIS — D509 Iron deficiency anemia, unspecified: Secondary | ICD-10-CM

## 2013-07-23 DIAGNOSIS — R600 Localized edema: Secondary | ICD-10-CM

## 2013-07-23 DIAGNOSIS — M8708 Idiopathic aseptic necrosis of bone, other site: Secondary | ICD-10-CM | POA: Diagnosis present

## 2013-07-23 DIAGNOSIS — M069 Rheumatoid arthritis, unspecified: Secondary | ICD-10-CM

## 2013-07-23 DIAGNOSIS — R911 Solitary pulmonary nodule: Secondary | ICD-10-CM | POA: Diagnosis present

## 2013-07-23 DIAGNOSIS — IMO0002 Reserved for concepts with insufficient information to code with codable children: Secondary | ICD-10-CM

## 2013-07-23 DIAGNOSIS — Z79899 Other long term (current) drug therapy: Secondary | ICD-10-CM

## 2013-07-23 DIAGNOSIS — E876 Hypokalemia: Secondary | ICD-10-CM | POA: Diagnosis not present

## 2013-07-23 DIAGNOSIS — F17200 Nicotine dependence, unspecified, uncomplicated: Secondary | ICD-10-CM | POA: Diagnosis present

## 2013-07-23 DIAGNOSIS — S72413A Displaced unspecified condyle fracture of lower end of unspecified femur, initial encounter for closed fracture: Principal | ICD-10-CM

## 2013-07-23 DIAGNOSIS — T502X5A Adverse effect of carbonic-anhydrase inhibitors, benzothiadiazides and other diuretics, initial encounter: Secondary | ICD-10-CM | POA: Diagnosis present

## 2013-07-23 DIAGNOSIS — W19XXXA Unspecified fall, initial encounter: Secondary | ICD-10-CM | POA: Diagnosis present

## 2013-07-23 DIAGNOSIS — J4489 Other specified chronic obstructive pulmonary disease: Secondary | ICD-10-CM

## 2013-07-23 DIAGNOSIS — J449 Chronic obstructive pulmonary disease, unspecified: Secondary | ICD-10-CM

## 2013-07-23 DIAGNOSIS — Z720 Tobacco use: Secondary | ICD-10-CM

## 2013-07-23 LAB — CBC WITH DIFFERENTIAL/PLATELET
BASOS ABS: 0 10*3/uL (ref 0.0–0.1)
Basophils Relative: 0 % (ref 0–1)
Eosinophils Absolute: 0 10*3/uL (ref 0.0–0.7)
Eosinophils Relative: 0 % (ref 0–5)
HCT: 29.5 % — ABNORMAL LOW (ref 39.0–52.0)
HEMOGLOBIN: 9 g/dL — AB (ref 13.0–17.0)
Lymphocytes Relative: 10 % — ABNORMAL LOW (ref 12–46)
Lymphs Abs: 1.1 10*3/uL (ref 0.7–4.0)
MCH: 23.4 pg — ABNORMAL LOW (ref 26.0–34.0)
MCHC: 30.5 g/dL (ref 30.0–36.0)
MCV: 76.8 fL — ABNORMAL LOW (ref 78.0–100.0)
MONOS PCT: 6 % (ref 3–12)
Monocytes Absolute: 0.6 10*3/uL (ref 0.1–1.0)
NEUTROS PCT: 84 % — AB (ref 43–77)
Neutro Abs: 8.9 10*3/uL — ABNORMAL HIGH (ref 1.7–7.7)
Platelets: 450 10*3/uL — ABNORMAL HIGH (ref 150–400)
RBC: 3.84 MIL/uL — ABNORMAL LOW (ref 4.22–5.81)
RDW: 19.1 % — AB (ref 11.5–15.5)
WBC: 10.7 10*3/uL — ABNORMAL HIGH (ref 4.0–10.5)

## 2013-07-23 LAB — PROTIME-INR
INR: 1.21 (ref 0.00–1.49)
Prothrombin Time: 15 seconds (ref 11.6–15.2)

## 2013-07-23 LAB — BASIC METABOLIC PANEL
BUN: 10 mg/dL (ref 6–23)
CO2: 25 mEq/L (ref 19–32)
Calcium: 8.2 mg/dL — ABNORMAL LOW (ref 8.4–10.5)
Chloride: 101 mEq/L (ref 96–112)
Creatinine, Ser: 0.54 mg/dL (ref 0.50–1.35)
GFR calc Af Amer: >90 mL/min (ref >90)
GFR calc non Af Amer: >90 mL/min (ref >90)
Glucose, Bld: 93 mg/dL (ref 70–99)
Potassium: 3.9 mEq/L (ref 3.7–5.3)
Sodium: 140 mEq/L (ref 137–147)

## 2013-07-23 MED ORDER — ONDANSETRON HCL 4 MG/2ML IJ SOLN
4.0000 mg | Freq: Once | INTRAMUSCULAR | Status: AC
  Administered 2013-07-23: 4 mg via INTRAVENOUS
  Filled 2013-07-23: qty 2

## 2013-07-23 MED ORDER — SODIUM CHLORIDE 0.9 % IV SOLN
1000.0000 mL | INTRAVENOUS | Status: DC
  Administered 2013-07-23: 1000 mL via INTRAVENOUS

## 2013-07-23 MED ORDER — HYDROMORPHONE HCL PF 1 MG/ML IJ SOLN
1.0000 mg | INTRAMUSCULAR | Status: AC | PRN
  Administered 2013-07-23 – 2013-07-24 (×2): 1 mg via INTRAVENOUS
  Filled 2013-07-23 (×2): qty 1

## 2013-07-23 MED ORDER — SODIUM CHLORIDE 0.9 % IV SOLN
INTRAVENOUS | Status: DC
Start: 2013-07-23 — End: 2013-07-26
  Administered 2013-07-23: 125 mL/h via INTRAVENOUS
  Administered 2013-07-24: 04:00:00 via INTRAVENOUS

## 2013-07-23 MED ORDER — POTASSIUM CHLORIDE IN NACL 20-0.45 MEQ/L-% IV SOLN
INTRAVENOUS | Status: DC
Start: 2013-07-23 — End: 2013-07-23

## 2013-07-23 MED ORDER — FUROSEMIDE 20 MG PO TABS
20.0000 mg | ORAL_TABLET | Freq: Every day | ORAL | Status: DC
  Administered 2013-07-23 – 2013-07-25 (×3): 20 mg via ORAL
  Filled 2013-07-23 (×3): qty 1

## 2013-07-23 MED ORDER — PANTOPRAZOLE SODIUM 40 MG PO TBEC
40.0000 mg | DELAYED_RELEASE_TABLET | Freq: Every day | ORAL | Status: DC
  Administered 2013-07-23 – 2013-08-01 (×9): 40 mg via ORAL
  Filled 2013-07-23 (×9): qty 1

## 2013-07-23 MED ORDER — HEPARIN SODIUM (PORCINE) 5000 UNIT/ML IJ SOLN
5000.0000 [IU] | Freq: Three times a day (TID) | INTRAMUSCULAR | Status: DC
  Administered 2013-07-23 – 2013-07-25 (×5): 5000 [IU] via SUBCUTANEOUS
  Filled 2013-07-23 (×4): qty 1

## 2013-07-23 MED ORDER — ADULT MULTIVITAMIN W/MINERALS CH
1.0000 | ORAL_TABLET | Freq: Every day | ORAL | Status: DC
  Administered 2013-07-23 – 2013-08-01 (×9): 1 via ORAL
  Filled 2013-07-23 (×9): qty 1

## 2013-07-23 MED ORDER — FERROUS SULFATE 325 (65 FE) MG PO TABS
325.0000 mg | ORAL_TABLET | Freq: Every day | ORAL | Status: DC
  Administered 2013-07-24 – 2013-08-01 (×9): 325 mg via ORAL
  Filled 2013-07-23 (×11): qty 1

## 2013-07-23 MED ORDER — ALBUTEROL SULFATE HFA 108 (90 BASE) MCG/ACT IN AERS
2.0000 | INHALATION_SPRAY | Freq: Four times a day (QID) | RESPIRATORY_TRACT | Status: DC | PRN
  Filled 2013-07-23: qty 6.7

## 2013-07-23 MED ORDER — NICOTINE 14 MG/24HR TD PT24
14.0000 mg | MEDICATED_PATCH | Freq: Every day | TRANSDERMAL | Status: DC
  Administered 2013-07-23 – 2013-07-31 (×9): 14 mg via TRANSDERMAL
  Filled 2013-07-23 (×9): qty 1

## 2013-07-23 MED ORDER — ONDANSETRON HCL 4 MG/2ML IJ SOLN: 4.0000 mg | Freq: Three times a day (TID) | INTRAMUSCULAR | Status: AC | PRN

## 2013-07-23 MED ORDER — HYDROMORPHONE HCL PF 1 MG/ML IJ SOLN
1.0000 mg | INTRAMUSCULAR | Status: AC | PRN
  Administered 2013-07-23 (×3): 1 mg via INTRAVENOUS
  Filled 2013-07-23 (×3): qty 1

## 2013-07-23 MED ORDER — HYDROCODONE-ACETAMINOPHEN 5-325 MG PO TABS
1.0000 | ORAL_TABLET | ORAL | Status: AC | PRN
  Administered 2013-07-24: 2 via ORAL
  Filled 2013-07-23: qty 2

## 2013-07-23 MED ORDER — POTASSIUM CHLORIDE CRYS ER 10 MEQ PO TBCR
10.0000 meq | EXTENDED_RELEASE_TABLET | Freq: Every day | ORAL | Status: DC
  Administered 2013-07-23 – 2013-07-25 (×3): 10 meq via ORAL
  Filled 2013-07-23 (×3): qty 1

## 2013-07-23 NOTE — ED Provider Notes (Signed)
CSN: [id number]     Arrival date & time 07/23/13  1555 History  This chart was scribed for Celene Kras, MD by Danella Maiers, ED Scribe. This patient was seen in room APA11/APA11 and the patient's care was started at 5:34 PM.    Chief Complaint  Patient presents with  . Knee Pain   The history is provided by the patient. No language interpreter was used.   HPI Comments: Joe Mccullough. is a 58 y.o. male who presents to the Emergency Department complaining of constant unchanged sudden-onset right knee pain since falling onto the knee today. Pt was sent here from Urgent Care for further eval. He states the enitre leg hurts including the hip. He denies ankle or foot pain and back pain. No chronic medical problems. He takes hydrocodone for chronic arthritic knee pain.   Past Medical History  Diagnosis Date  . Rheumatoid arthritis(714.0) May 2013    Diagnosed with elevated rheumatoid factor/titer  . COPD (chronic obstructive pulmonary disease)   . Tobacco abuse   . Emphysema 01/30/2012  . Bilateral lower extremity edema 01/29/2012    Negative for DVT; presumed to be secondary to NSAIDs  . Hilar adenopathy 01/31/2012  . Pulmonary nodule, left 01/31/2012   Past Surgical History  Procedure Laterality Date  . Inguinal hernia repair      As a child.   History reviewed. No pertinent family history. History  Substance Use Topics  . Smoking status: Current Every Day Smoker  . Smokeless tobacco: Not on file  . Alcohol Use: No     Comment: former    Review of Systems  Musculoskeletal: Positive for arthralgias (knee and hip). Negative for back pain.   A complete 10 system review of systems was obtained and all systems are negative except as noted in the HPI and PMH.     Allergies  Morphine and related  Home Medications   Prior to Admission medications   Medication Sig Start Date End Date Taking? Authorizing Provider  acetaminophen (TYLENOL) 325 MG tablet Take 2 tablets (650 mg  total) by mouth every 6 (six) hours as needed for pain. 01/31/12   Elliot Cousin, MD  albuterol (PROVENTIL HFA;VENTOLIN HFA) 108 (90 BASE) MCG/ACT inhaler Inhale 2 puffs into the lungs 3 (three) times daily. 01/31/12   Elliot Cousin, MD  diclofenac sodium (VOLTAREN) 1 % GEL Apply 2 g topically 3 (three) times daily. 01/31/12   Elliot Cousin, MD  ferrous sulfate 325 (65 FE) MG tablet Take 1 tablet (325 mg total) by mouth daily with breakfast. Iron supplement to treat your anemia. 01/31/12   Elliot Cousin, MD  furosemide (LASIX) 20 MG tablet Take 1 tablet (20 mg total) by mouth daily. Take daily for swelling in your legs. 01/31/12   Elliot Cousin, MD  methotrexate 2.5 MG tablet Take 7.5 mg by mouth once a week.    Historical Provider, MD  Multiple Vitamin (MULTIVITAMIN WITH MINERALS) TABS Take 1 tablet by mouth daily. 01/31/12   Elliot Cousin, MD  pantoprazole (PROTONIX) 40 MG tablet Take 1 tablet (40 mg total) by mouth daily. Medicine to protect your stomach lining from bleeding. 01/31/12   Elliot Cousin, MD  potassium chloride SA (K-DUR,KLOR-CON) 10 MEQ tablet Take 1 tablet (10 mEq total) by mouth daily. Take with furosemide (Lasix). 01/31/12   Elliot Cousin, MD  predniSONE (DELTASONE) 10 MG tablet For treatment of your rheumatoid arthritis and pain: Starting tomorrow, take 6 tablets for 1 day; then 5 tablets  next day; then 4 tablets the next day; then 3 tablets next day; then 2 tablets next day; then 1 tablet the next day; then stop. 01/31/12   Elliot Cousin, MD   BP 125/75  Pulse 84  Temp(Src) 98.1 F (36.7 C) (Oral)  Resp 18  Ht 5\' 8"  (1.727 m)  Wt 140 lb (63.504 kg)  BMI 21.29 kg/m2  SpO2 98% Physical Exam  Nursing note and vitals reviewed. Constitutional: He appears well-developed and well-nourished. No distress.  HENT:  Head: Normocephalic and atraumatic.  Right Ear: External ear normal.  Left Ear: External ear normal.  Eyes: Conjunctivae are normal. Right eye exhibits no discharge. Left eye  exhibits no discharge. No scleral icterus.  Neck: Neck supple. No tracheal deviation present.  Cardiovascular: Normal rate.   Pulmonary/Chest: Effort normal. No stridor. No respiratory distress.  Musculoskeletal: He exhibits no edema.       Right hip: He exhibits tenderness and bony tenderness. He exhibits no swelling.       Right knee: He exhibits decreased range of motion, swelling, effusion and bony tenderness. He exhibits no laceration. Tenderness found.       Right ankle: Normal.  Neurological: He is alert. Cranial nerve deficit: no gross deficits.  Skin: Skin is warm and dry. No rash noted.  Psychiatric: He has a normal mood and affect.    ED Course  Procedures (including critical care time) Medications  0.9 %  sodium chloride infusion (125 mL/hr Intravenous New Bag/Given 07/23/13 1807)  0.9 %  sodium chloride infusion (1,000 mLs Intravenous New Bag/Given 07/23/13 1859)  HYDROmorphone (DILAUDID) injection 1 mg (1 mg Intravenous Given 07/23/13 1939)  ondansetron (ZOFRAN) injection 4 mg (4 mg Intravenous Given 07/23/13 1901)    DIAGNOSTIC STUDIES: Oxygen Saturation is 98% on RA, normal by my interpretation.    COORDINATION OF CARE: 5:47 PM- Discussed treatment plan with pt which includes hip x-ray. Will consult ortho. Pt agrees to plan.  7:09 PM - Rechecked with pt to let him know that the hip and femur are broken as well as the knee. Plan for admission and will need surgery. Pt agrees.     Labs Review Labs Reviewed  BASIC METABOLIC PANEL  CBC WITH DIFFERENTIAL  PROTIME-INR  TYPE AND SCREEN    Imaging Review Dg Chest 1 View  07/23/2013   CLINICAL DATA:  Fall, severe right hip in knee pain, rheumatoid arthritis and COPD  EXAM: CHEST - 1 VIEW  COMPARISON:  Prior chest CT 01/29/2012; prior chest x-ray 01/29/2012  FINDINGS: Cardiac and mediastinal contours within normal limits for size. No focal airspace consolidation, pneumothorax or pleural effusion. Diffuse central bronchitic  changes and mild interstitial prominence. Unremarkable visualized upper abdominal bowel gas pattern.  IMPRESSION: Stable chest x-ray without evidence of acute cardiopulmonary process.   Electronically Signed   By: 13/05/2011 M.D.   On: 07/23/2013 19:20   Dg Pelvis 1-2 Views  07/23/2013   CLINICAL DATA:  Fall.  Right pelvic and hip pain.  EXAM: PELVIS - 1-2 VIEW  COMPARISON:  CT on 01/30/2012  FINDINGS: Acute fracture of the right femoral neck is seen. No evidence of hip dislocation.  Fracture deformity of the left pubic bone is seen which appears chronic. Multiple surgical clips and staples are seen in the left pelvis. No acute pelvic fracture identified.  IMPRESSION: Acute right femoral neck fracture.  Old fracture deformity of the left pubis. No acute pelvic fracture identified.   Electronically Signed   By: 13/06/2011  Eppie Gibson M.D.   On: 07/23/2013 19:06   Dg Femur Right  07/23/2013   CLINICAL DATA:  Fall.  Right hip and knee pain.  EXAM: RIGHT FEMUR - 2 VIEW  COMPARISON:  None.  FINDINGS: Mildly displaced right femoral neck fracture is seen. No distal femur fracture identified.  IMPRESSION: Mildly displaced right femoral neck fracture.   Electronically Signed   By: Myles Rosenthal M.D.   On: 07/23/2013 19:07   Dg Knee Complete 4 Views Right  07/23/2013   CLINICAL DATA:  Right knee pain after fall.  EXAM: RIGHT KNEE - COMPLETE 4+ VIEW  COMPARISON:  June 21, 2011.  FINDINGS: Mild suprapatellar joint effusion is noted. There appears to be a comminuted fracture involving the articular surface of the medial femoral condyle. No dislocation is noted on the lateral radiograph.  IMPRESSION: Comminuted fracture seen involving the medial femoral condyle. Mild joint effusion is noted.   Electronically Signed   By: Roque Lias M.D.   On: 07/23/2013 16:43     MDM   Final diagnoses:  Femoral neck fracture  Femoral condyle fracture    Consult with Dr Romeo Apple and Dr Karilyn Cota.  Pt will be admitted to hospital for  further treatment.  His RA with steroid use and methotrexate use may have contributed to the fractures.  I personally performed the services described in this documentation, which was scribed in my presence.  The recorded information has been reviewed and is accurate.   Celene Kras, MD 07/23/13 2006

## 2013-07-23 NOTE — H&P (Signed)
[facility] History and Physical  Talal Fritchman. UDJ:[id number] DOB: Feb 12, 1956 DOA: 07/23/2013  Referring physician: ER. PCP: Colette Ribas, MD   Chief Complaint: Right knee and hip pain.  HPI: Joe Mccullough. is a 58 y.o. male  This is a 58 year old man who has fairly crippling rheumatoid arthritis who presents having had a fall and sustaining pain in the right knee and also right hip. He says that her leg gave way and he fell in the grass. Further evaluation in the emergency room showed him to a fracture in the right knee area and also the right hip neck of femur. He is now being admitted for management of the fractures.   Review of Systems:  Constitutional:  No weight loss, night sweats, Fevers, chills, fatigue.  HEENT:  No headaches, Difficulty swallowing,Tooth/dental problems,Sore throat,  No sneezing, itching, ear ache, nasal congestion, post nasal drip,  Cardio-vascular:  No chest pain, Orthopnea, PND, swelling in lower extremities, anasarca, dizziness, palpitations  GI:  No heartburn, indigestion, abdominal pain, nausea, vomiting, diarrhea, change in bowel habits, loss of appetite  Resp:  No shortness of breath with exertion or at rest. No excess mucus, no productive cough, No non-productive cough, No coughing up of blood.No change in color of mucus.No wheezing.No chest wall deformity  Skin:  no rash or lesions.  GU:  no dysuria, change in color of urine, no urgency or frequency. No flank pain.   Psych:  No change in mood or affect. No depression or anxiety. No memory loss.   Past Medical History  Diagnosis Date  . Rheumatoid arthritis(714.0) May 2013    Diagnosed with elevated rheumatoid factor/titer  . COPD (chronic obstructive pulmonary disease)   . Tobacco abuse   . Emphysema 01/30/2012  . Bilateral lower extremity edema 01/29/2012    Negative for DVT; presumed to be secondary to NSAIDs  . Hilar adenopathy 01/31/2012  . Pulmonary nodule, left  01/31/2012   Past Surgical History  Procedure Laterality Date  . Inguinal hernia repair      As a child.   Social History:  reports that he has been smoking.  He does not have any smokeless tobacco history on file. He reports that he does not drink alcohol or use illicit drugs.  Allergies  Allergen Reactions  . Morphine And Related     Delirium    History reviewed. No pertinent family history.   Prior to Admission medications   Medication Sig Start Date End Date Taking? Authorizing Provider  albuterol (PROVENTIL HFA;VENTOLIN HFA) 108 (90 BASE) MCG/ACT inhaler Inhale 2 puffs into the lungs every 6 (six) hours as needed for wheezing or shortness of breath. 01/31/12  Yes Elliot Cousin, MD  furosemide (LASIX) 20 MG tablet Take 1 tablet (20 mg total) by mouth daily. Take daily for swelling in your legs. 01/31/12  Yes Elliot Cousin, MD  HYDROCODONE-ACETAMINOPHEN PO Take 1 tablet by mouth every 6 (six) hours as needed.   Yes Historical Provider, MD  acetaminophen (TYLENOL) 325 MG tablet Take 2 tablets (650 mg total) by mouth every 6 (six) hours as needed for pain. 01/31/12   Elliot Cousin, MD  ferrous sulfate 325 (65 FE) MG tablet Take 1 tablet (325 mg total) by mouth daily with breakfast. Iron supplement to treat your anemia. 01/31/12   Elliot Cousin, MD  methotrexate 2.5 MG tablet Take 7.5 mg by mouth once a week.    Historical Provider, MD  Multiple Vitamin (MULTIVITAMIN WITH MINERALS) TABS Take  1 tablet by mouth daily. 01/31/12   Elliot Cousin, MD  pantoprazole (PROTONIX) 40 MG tablet Take 1 tablet (40 mg total) by mouth daily. Medicine to protect your stomach lining from bleeding. 01/31/12   Elliot Cousin, MD  potassium chloride SA (K-DUR,KLOR-CON) 10 MEQ tablet Take 1 tablet (10 mEq total) by mouth daily. Take with furosemide (Lasix). 01/31/12   Elliot Cousin, MD  predniSONE (DELTASONE) 10 MG tablet For treatment of your rheumatoid arthritis and pain: Starting tomorrow, take 6 tablets for 1 day;  then 5 tablets next day; then 4 tablets the next day; then 3 tablets next day; then 2 tablets next day; then 1 tablet the next day; then stop. 01/31/12   Elliot Cousin, MD   Physical Exam: Filed Vitals:   07/23/13 2049  BP: 129/82  Pulse: 94  Temp:   Resp: 18    BP 129/82  Pulse 94  Temp(Src) 98.1 F (36.7 C) (Oral)  Resp 18  Ht 5\' 8"  (1.727 m)  Wt 63.504 kg (140 lb)  BMI 21.29 kg/m2  SpO2 94%  General:   Appears to be in pain. Eyes: PERRL, normal lids, irises & conjunctiva ENT: grossly normal hearing, lips & tongue Neck: no LAD, masses or thyromegaly Cardiovascular: RRR, no m/r/g. No LE edema. Telemetry: SR, no arrhythmias  Respiratory: CTA bilaterally, no w/r/r. Normal respiratory effort. Abdomen: soft, ntnd Skin: no rash or induration seen on limited exam Musculoskeletal: Swollen right knee. Patient is in a fetal position and therefore difficult to examine the right hip. Psychiatric: grossly normal mood and affect, speech fluent and appropriate Neurologic: grossly non-focal.          Labs on Admission:  Basic Metabolic Panel:  Recent Labs Lab 07/23/13 1958  NA 140  K 3.9  CL 101  CO2 25  GLUCOSE 93  BUN 10  CREATININE 0.54  CALCIUM 8.2*       CBC:  Recent Labs Lab 07/23/13 1958  WBC 10.7*  NEUTROABS 8.9*  HGB 9.0*  HCT 29.5*  MCV 76.8*  PLT 450*      Radiological Exams on Admission: Dg Chest 1 View  07/23/2013   CLINICAL DATA:  Fall, severe right hip in knee pain, rheumatoid arthritis and COPD  EXAM: CHEST - 1 VIEW  COMPARISON:  Prior chest CT 01/29/2012; prior chest x-ray 01/29/2012  FINDINGS: Cardiac and mediastinal contours within normal limits for size. No focal airspace consolidation, pneumothorax or pleural effusion. Diffuse central bronchitic changes and mild interstitial prominence. Unremarkable visualized upper abdominal bowel gas pattern.  IMPRESSION: Stable chest x-ray without evidence of acute cardiopulmonary process.    Electronically Signed   By: Malachy Moan M.D.   On: 07/23/2013 19:20   Dg Pelvis 1-2 Views  07/23/2013   CLINICAL DATA:  Fall.  Right pelvic and hip pain.  EXAM: PELVIS - 1-2 VIEW  COMPARISON:  CT on 01/30/2012  FINDINGS: Acute fracture of the right femoral neck is seen. No evidence of hip dislocation.  Fracture deformity of the left pubic bone is seen which appears chronic. Multiple surgical clips and staples are seen in the left pelvis. No acute pelvic fracture identified.  IMPRESSION: Acute right femoral neck fracture.  Old fracture deformity of the left pubis. No acute pelvic fracture identified.   Electronically Signed   By: Myles Rosenthal M.D.   On: 07/23/2013 19:06   Dg Femur Right  07/23/2013   CLINICAL DATA:  Fall.  Right hip and knee pain.  EXAM: RIGHT FEMUR -  2 VIEW  COMPARISON:  None.  FINDINGS: Mildly displaced right femoral neck fracture is seen. No distal femur fracture identified.  IMPRESSION: Mildly displaced right femoral neck fracture.   Electronically Signed   By: Myles Rosenthal M.D.   On: 07/23/2013 19:07   Dg Knee Complete 4 Views Right  07/23/2013   CLINICAL DATA:  Right knee pain after fall.  EXAM: RIGHT KNEE - COMPLETE 4+ VIEW  COMPARISON:  June 21, 2011.  FINDINGS: Mild suprapatellar joint effusion is noted. There appears to be a comminuted fracture involving the articular surface of the medial femoral condyle. No dislocation is noted on the lateral radiograph.  IMPRESSION: Comminuted fracture seen involving the medial femoral condyle. Mild joint effusion is noted.   Electronically Signed   By: Roque Lias M.D.   On: 07/23/2013 16:43      Assessment/Plan   1. Right comminuted fracture of the medial femoral condyle. 2. Acute right femoral neck fracture. 3. Crippling rheumatoid arthritis. 4. COPD, stable. 5. Anemia of chronic disease versus iron deficiency.  Plan: 1. Admit to medical floor. 2. Analgesia as required. 3. Orthopedic consultation is appreciated for  upcoming surgery.  Further recommendations will depend on patient's hospital progress.  Code Status: Full code.   Family Communication: I discussed the plan with patient at the bedside.  Disposition Plan: Patient will likely require rehabilitation in a skilled nursing facility postoperatively.   Time spent: 60 minutes.  Wilson Singer [facility] Pager [phone].

## 2013-07-23 NOTE — ED Notes (Signed)
Fell injurying rt knee, Sent from Urgent Care for eval.  Supervisor with him.

## 2013-07-23 NOTE — Progress Notes (Signed)
Patient ID: Joe Mccullough., male   DOB: Feb 19, 1956, 58 y.o.   MRN: [id number]  Chart x rays reviewed   No surgery has been scheduled at this time so he can eat   The femur abnormality does not look acute it looks like avn of the knee   He will need preop blood transfusion  i do not know when the surgery can be done because of other hip fractures admitted today before him

## 2013-07-24 DIAGNOSIS — D509 Iron deficiency anemia, unspecified: Secondary | ICD-10-CM

## 2013-07-24 DIAGNOSIS — IMO0002 Reserved for concepts with insufficient information to code with codable children: Secondary | ICD-10-CM | POA: Diagnosis present

## 2013-07-24 DIAGNOSIS — S72413A Displaced unspecified condyle fracture of lower end of unspecified femur, initial encounter for closed fracture: Principal | ICD-10-CM

## 2013-07-24 LAB — COMPREHENSIVE METABOLIC PANEL
ALT: <5 U/L (ref 0–53)
AST: 12 U/L (ref 0–37)
Albumin: 2.1 g/dL — ABNORMAL LOW (ref 3.5–5.2)
Alkaline Phosphatase: 123 U/L — ABNORMAL HIGH (ref 39–117)
BILIRUBIN TOTAL: 0.2 mg/dL — AB (ref 0.3–1.2)
BUN: 8 mg/dL (ref 6–23)
CALCIUM: 8.2 mg/dL — AB (ref 8.4–10.5)
CHLORIDE: 100 meq/L (ref 96–112)
CO2: 26 meq/L (ref 19–32)
Creatinine, Ser: 0.59 mg/dL (ref 0.50–1.35)
GLUCOSE: 95 mg/dL (ref 70–99)
Potassium: 4 mEq/L (ref 3.7–5.3)
Sodium: 140 mEq/L (ref 137–147)
Total Protein: 7.3 g/dL (ref 6.0–8.3)

## 2013-07-24 LAB — IRON AND TIBC
Iron: <10 ug/dL — ABNORMAL LOW (ref 42–135)
UIBC: 157 ug/dL (ref 125–400)

## 2013-07-24 LAB — RETICULOCYTES
RBC.: 3.82 MIL/uL — ABNORMAL LOW (ref 4.22–5.81)
RETIC CT PCT: 1.3 % (ref 0.4–3.1)
Retic Count, Absolute: 49.7 10*3/uL (ref 19.0–186.0)

## 2013-07-24 LAB — URINALYSIS, ROUTINE W REFLEX MICROSCOPIC
Bilirubin Urine: NEGATIVE
GLUCOSE, UA: NEGATIVE mg/dL
HGB URINE DIPSTICK: NEGATIVE
Leukocytes, UA: NEGATIVE
Nitrite: NEGATIVE
PH: 6 (ref 5.0–8.0)
PROTEIN: NEGATIVE mg/dL
Specific Gravity, Urine: >1.03 — ABNORMAL HIGH (ref 1.005–1.030)
Urobilinogen, UA: 0.2 mg/dL (ref 0.0–1.0)

## 2013-07-24 LAB — CBC
HCT: 30.1 % — ABNORMAL LOW (ref 39.0–52.0)
HEMOGLOBIN: 9.1 g/dL — AB (ref 13.0–17.0)
MCH: 23.4 pg — AB (ref 26.0–34.0)
MCHC: 30.2 g/dL (ref 30.0–36.0)
MCV: 77.4 fL — ABNORMAL LOW (ref 78.0–100.0)
PLATELETS: 487 10*3/uL — AB (ref 150–400)
RBC: 3.89 MIL/uL — AB (ref 4.22–5.81)
RDW: 19.4 % — ABNORMAL HIGH (ref 11.5–15.5)
WBC: 9.6 10*3/uL (ref 4.0–10.5)

## 2013-07-24 LAB — FERRITIN: FERRITIN: 462 ng/mL — AB (ref 22–322)

## 2013-07-24 LAB — ABO/RH: ABO/RH(D): A POS

## 2013-07-24 LAB — VITAMIN B12: Vitamin B-12: 306 pg/mL (ref 211–911)

## 2013-07-24 LAB — FOLATE: Folate: 13.3 ng/mL

## 2013-07-24 LAB — PREPARE RBC (CROSSMATCH)

## 2013-07-24 MED ORDER — HYDROMORPHONE HCL PF 1 MG/ML IJ SOLN
0.5000 mg | INTRAMUSCULAR | Status: DC | PRN
  Administered 2013-07-26 – 2013-07-28 (×7): 0.5 mg via INTRAVENOUS
  Filled 2013-07-24 (×8): qty 1

## 2013-07-24 MED ORDER — HYDROCODONE-ACETAMINOPHEN 7.5-325 MG PO TABS
1.0000 | ORAL_TABLET | ORAL | Status: DC | PRN
  Administered 2013-07-24 – 2013-07-27 (×12): 2 via ORAL
  Administered 2013-07-27: 1 via ORAL
  Administered 2013-07-27 – 2013-08-01 (×19): 2 via ORAL
  Filled 2013-07-24 (×10): qty 2
  Filled 2013-07-24: qty 1
  Filled 2013-07-24 (×22): qty 2

## 2013-07-24 MED ORDER — ALBUTEROL SULFATE (2.5 MG/3ML) 0.083% IN NEBU: 2.5000 mg | INHALATION_SOLUTION | Freq: Four times a day (QID) | RESPIRATORY_TRACT | Status: DC | PRN

## 2013-07-24 MED ORDER — METHOCARBAMOL 500 MG PO TABS
500.0000 mg | ORAL_TABLET | Freq: Three times a day (TID) | ORAL | Status: DC
  Administered 2013-07-24 – 2013-07-25 (×6): 500 mg via ORAL
  Filled 2013-07-24 (×7): qty 1

## 2013-07-24 NOTE — Progress Notes (Signed)
Patient seen, independently examined and chart reviewed. I agree with exam, assessment and plan discussed with Toya Smothers, NP.  58 year old man with history of recurrent redness presented with history of grade hip and knee pain status post fall. Imaging revealed fractures of the right medial femoral condyle and a right femoral neck.  PMH RA maintained on methotrexate; steroids? COPD Chronic LE edema Pulmonary nodule  Anemia of chronic disease? Tobacco dependence.  Subjective: Pain overall control. No issues.  Objective: Afebrile, vital signs stable. No hypoxia. Appears calm and comfortable. Speech fluent and clear. Cardiovascular regular rate and rhythm. No murmur, rub or gallop. No lower extremity edema. Respiratory clear to auscultation bilaterally. No wheezes, rales or rhonchi. Normal respiratory effort.  Hemoglobin stable 9.1. Likely close to baseline. Iron low suggesting iron deficiency.  Overall stable. He has no history of cardiac disease and pulmonary status appears stable. Operative management of and the fracture as planned. Recommended outpatient followup for her deficiency anemia, outpatient GI evaluation. Recommend smoking cessation. Obtain EKG for baseline purposes.  Brendia Sacks, MD [facility] [phone]

## 2013-07-24 NOTE — Progress Notes (Signed)
[facility] PROGRESS NOTE  Joe Mccullough. BHA:[id number] DOB: 04-30-1955 DOA: 07/23/2013 PCP: Colette Ribas, MD  Summary: 58 year old man with rheumatoid arthritis admitted with right femoral neck fx and right knee fx after a fall. Orthopedic surgery pending.   Assessment/Plan:  1. Right hip fx: xray with right femoral neck fx after mechanical fall. Dr. Romeo Apple with ortho managing. Ortho note indicates surgery needed but not scheduled as yet.  Will transfuse 1 unit PRBc's preoperatively per ortho.pain management.   2. Right knee fx: after mechanical fall. Defer management to ortho. See #1  3. Microcytic anemia: chart review indicated hx of same. Will obtain FOBT and anemia panel. Current Hg 9.1 appears to be little below baseline. Could be dilutional. Will transfuse 1 unit PRBC's in anticipation of surgery.   4. Rheumatoid arthritis: with joint deformity. Pt reports working full time. On methotrexate at home. This is on hold for now.   5. COPD: current smoker. Appears stable at baseline. Monitor  6.hx of pulmonary nodule: chart review indicates CT 2013 noted 62mm LLL nodule. At that time follow up CT 1 year recommended. No documentation that this was done.   7. Tobacco use. Counseled regarding cessation. Nicotine patch provided.   Code Status: full Family Communication: none present Disposition Plan: will likely need rehab after surgery   Consultants:  Dr. Romeo Apple ortho  Procedures:  none  Antibiotics:  none  HPI/Subjective: Awake alert. Reports fair pain management.   Objective: Filed Vitals:   07/24/13 0630  BP: 129/81  Pulse: 74  Temp: 98.1 F (36.7 C)  Resp: 14    Intake/Output Summary (Last 24 hours) at 07/24/13 0832 Last data filed at 07/23/13 2231  Gross per 24 hour  Intake   1670 ml  Output      0 ml  Net   1670 ml   Filed Weights   07/23/13 1606 07/23/13 2245  Weight: 63.504 kg (140 lb) 56.972 kg (125 lb 9.6 oz)     Exam:   General:  Somewhat unkept appearing, NAD  Cardiovascular: RRR No MGR trace LE edema  Respiratory: normal effort BS coarse throughout. No wheeze  Abdomen: non-distended non-tender +BS x4  Musculoskeletal: right knee with swelling and tenderness. Lying on side with knees drawn to chest. Pain to hip with attempting to reposition.    Data Reviewed: Basic Metabolic Panel:  Recent Labs Lab 07/23/13 1958 07/24/13 0409  NA 140 140  K 3.9 4.0  CL 101 100  CO2 25 26  GLUCOSE 93 95  BUN 10 8  CREATININE 0.54 0.59  CALCIUM 8.2* 8.2*   Liver Function Tests:  Recent Labs Lab 07/24/13 0409  AST 12  ALT <5  ALKPHOS 123*  BILITOT 0.2*  PROT 7.3  ALBUMIN 2.1*   No results found for this basename: LIPASE, AMYLASE,  in the last 168 hours No results found for this basename: AMMONIA,  in the last 168 hours CBC:  Recent Labs Lab 07/23/13 1958 07/24/13 0409  WBC 10.7* 9.6  NEUTROABS 8.9*  --   HGB 9.0* 9.1*  HCT 29.5* 30.1*  MCV 76.8* 77.4*  PLT 450* 487*   Cardiac Enzymes: No results found for this basename: CKTOTAL, CKMB, CKMBINDEX, TROPONINI,  in the last 168 hours BNP (last 3 results) No results found for this basename: PROBNP,  in the last 8760 hours CBG: No results found for this basename: GLUCAP,  in the last 168 hours  No results found for this or any  previous visit (from the past 240 hour(s)).   Studies: Dg Chest 1 View  07/23/2013   CLINICAL DATA:  Fall, severe right hip in knee pain, rheumatoid arthritis and COPD  EXAM: CHEST - 1 VIEW  COMPARISON:  Prior chest CT 01/29/2012; prior chest x-ray 01/29/2012  FINDINGS: Cardiac and mediastinal contours within normal limits for size. No focal airspace consolidation, pneumothorax or pleural effusion. Diffuse central bronchitic changes and mild interstitial prominence. Unremarkable visualized upper abdominal bowel gas pattern.  IMPRESSION: Stable chest x-ray without evidence of acute cardiopulmonary process.    Electronically Signed   By: Malachy Moan M.D.   On: 07/23/2013 19:20   Dg Pelvis 1-2 Views  07/23/2013   CLINICAL DATA:  Fall.  Right pelvic and hip pain.  EXAM: PELVIS - 1-2 VIEW  COMPARISON:  CT on 01/30/2012  FINDINGS: Acute fracture of the right femoral neck is seen. No evidence of hip dislocation.  Fracture deformity of the left pubic bone is seen which appears chronic. Multiple surgical clips and staples are seen in the left pelvis. No acute pelvic fracture identified.  IMPRESSION: Acute right femoral neck fracture.  Old fracture deformity of the left pubis. No acute pelvic fracture identified.   Electronically Signed   By: Myles Rosenthal M.D.   On: 07/23/2013 19:06   Dg Femur Right  07/23/2013   CLINICAL DATA:  Fall.  Right hip and knee pain.  EXAM: RIGHT FEMUR - 2 VIEW  COMPARISON:  None.  FINDINGS: Mildly displaced right femoral neck fracture is seen. No distal femur fracture identified.  IMPRESSION: Mildly displaced right femoral neck fracture.   Electronically Signed   By: Myles Rosenthal M.D.   On: 07/23/2013 19:07   Dg Knee Complete 4 Views Right  07/23/2013   CLINICAL DATA:  Right knee pain after fall.  EXAM: RIGHT KNEE - COMPLETE 4+ VIEW  COMPARISON:  June 21, 2011.  FINDINGS: Mild suprapatellar joint effusion is noted. There appears to be a comminuted fracture involving the articular surface of the medial femoral condyle. No dislocation is noted on the lateral radiograph.  IMPRESSION: Comminuted fracture seen involving the medial femoral condyle. Mild joint effusion is noted.   Electronically Signed   By: Roque Lias M.D.   On: 07/23/2013 16:43    Scheduled Meds: . ferrous sulfate  325 mg Oral Q breakfast  . furosemide  20 mg Oral Daily  . heparin  5,000 Units Subcutaneous 3 times per day  . multivitamin with minerals  1 tablet Oral Daily  . nicotine  14 mg Transdermal Daily  . pantoprazole  40 mg Oral Daily  . potassium chloride SA  10 mEq Oral Daily   Continuous Infusions: .  sodium chloride 100 mL/hr at 07/24/13 0330    Principal Problem:   Hip fracture Active Problems:   Tobacco abuse   COPD (chronic obstructive pulmonary disease) with chronic bronchitis   Microcytic anemia   Pulmonary nodule, left   Femoral neck fracture   Rheumatoid arthritis   Knee fracture, right    Time spent: 35 minutes    Lesle Chris [facility]  [facility] Pager [phone]. If 7PM-7AM, please contact night-coverage at [email or web address], password [facility] 07/24/2013, 8:32 AM  LOS: 1 day

## 2013-07-25 DIAGNOSIS — F17200 Nicotine dependence, unspecified, uncomplicated: Secondary | ICD-10-CM

## 2013-07-25 DIAGNOSIS — S82899A Other fracture of unspecified lower leg, initial encounter for closed fracture: Secondary | ICD-10-CM

## 2013-07-25 LAB — BASIC METABOLIC PANEL
BUN: 9 mg/dL (ref 6–23)
CHLORIDE: 98 meq/L (ref 96–112)
CO2: 28 mEq/L (ref 19–32)
CREATININE: 0.5 mg/dL (ref 0.50–1.35)
Calcium: 7.8 mg/dL — ABNORMAL LOW (ref 8.4–10.5)
GFR calc Af Amer: >90 mL/min (ref >90)
GFR calc non Af Amer: >90 mL/min (ref >90)
Glucose, Bld: 126 mg/dL — ABNORMAL HIGH (ref 70–99)
Potassium: 3.6 mEq/L — ABNORMAL LOW (ref 3.7–5.3)
Sodium: 134 mEq/L — ABNORMAL LOW (ref 137–147)

## 2013-07-25 LAB — SURGICAL PCR SCREEN
MRSA, PCR: NEGATIVE
STAPHYLOCOCCUS AUREUS: NEGATIVE

## 2013-07-25 LAB — CBC
HEMATOCRIT: 32.1 % — AB (ref 39.0–52.0)
Hemoglobin: 10.1 g/dL — ABNORMAL LOW (ref 13.0–17.0)
MCH: 24 pg — ABNORMAL LOW (ref 26.0–34.0)
MCHC: 31.5 g/dL (ref 30.0–36.0)
MCV: 76.2 fL — ABNORMAL LOW (ref 78.0–100.0)
Platelets: 377 10*3/uL (ref 150–400)
RBC: 4.21 MIL/uL — ABNORMAL LOW (ref 4.22–5.81)
RDW: 18.3 % — ABNORMAL HIGH (ref 11.5–15.5)
WBC: 7.7 10*3/uL (ref 4.0–10.5)

## 2013-07-25 LAB — PREPARE RBC (CROSSMATCH)

## 2013-07-25 MED ORDER — POTASSIUM CHLORIDE CRYS ER 20 MEQ PO TBCR
20.0000 meq | EXTENDED_RELEASE_TABLET | Freq: Two times a day (BID) | ORAL | Status: DC
  Administered 2013-07-25: 20 meq via ORAL
  Filled 2013-07-25: qty 1

## 2013-07-25 MED ORDER — POTASSIUM CHLORIDE CRYS ER 20 MEQ PO TBCR
40.0000 meq | EXTENDED_RELEASE_TABLET | Freq: Once | ORAL | Status: AC
  Administered 2013-07-25: 40 meq via ORAL

## 2013-07-25 NOTE — Progress Notes (Signed)
[facility] PROGRESS NOTE  Joe Mccullough. OFB:[id number] DOB: 09-02-1955 DOA: 07/23/2013 PCP: Colette Ribas, MD  Summary:  58 year old man with rheumatoid arthritis admitted with right femoral neck fx and right knee fx after a fall. Orthopedic surgery pending   Assessment/Plan: 1. Right hip fx: xray with right femoral neck fx after mechanical fall. Dr. Romeo Apple with ortho managing. Ortho note indicates surgery for tomorrow. Fair pain management.   2. Right knee fx: after mechanical fall. Defer management to ortho. See #1   3. Microcytic anemia: IDA most likely.  chart review indicated hx of same. Await FOBT. Anemia panel with iron <10, ferritin 462. Current Hg 10.1 s/p 1 unit PRBC's. Will benefit from OP GI follow up.   4. Rheumatoid arthritis: with joint deformity. Pt reports working full time. On methotrexate at home. This is on hold for now.   5. COPD: current smoker. BS coarse. Will encourage IS.  Appears stable at baseline. Monitor   6.hx of pulmonary nodule: chart review indicates CT 2013 noted 76mm LLL nodule. At that time follow up CT 1 year recommended. No documentation that this was done.   7. Tobacco use. Counseled regarding cessation. Nicotine patch provided.   8. Hypokalemia: likely related to lasix. Will replete and recheck. Hold lasix for now.    Code Status: full Family Communication: none present Disposition Plan: likely need snf after surgery   Consultants:  Dr. Romeo Apple orthopedics  Procedures:  none  Antibiotics:  none  HPI/Subjective: Awake alert. Reports fair pain control.   Objective: Filed Vitals:   07/25/13 0634  BP: 126/76  Pulse: 78  Temp: 98.1 F (36.7 C)  Resp: 18    Intake/Output Summary (Last 24 hours) at 07/25/13 0819 Last data filed at 07/24/13 2110  Gross per 24 hour  Intake 2747.83 ml  Output      0 ml  Net 2747.83 ml   Filed Weights   07/23/13 1606 07/23/13 2245  Weight: 63.504 kg (140 lb) 56.972 kg  (125 lb 9.6 oz)    Exam:   General:  Well nourished NAD  Cardiovascular: RRR No MGR left leg with trace LE edema. Right knee/lower leg 1-2+LE edema as well as tenderness.   Respiratory: normal effort BS coarse throughout, clear somewhat with cough. No crackles or wheeze  Abdomen: soft +BS non-tender to palpation  Musculoskeletal: right knee.leg with edema and tenderness. No erythema or warmth. Foot warm with PPP. Lying on left side with knees drawn up to chest.    Data Reviewed: Basic Metabolic Panel:  Recent Labs Lab 07/23/13 1958 07/24/13 0409 07/25/13 0545  NA 140 140 134*  K 3.9 4.0 3.6*  CL 101 100 98  CO2 25 26 28   GLUCOSE 93 95 126*  BUN 10 8 9   CREATININE 0.54 0.59 0.50  CALCIUM 8.2* 8.2* 7.8*   Liver Function Tests:  Recent Labs Lab 07/24/13 0409  AST 12  ALT <5  ALKPHOS 123*  BILITOT 0.2*  PROT 7.3  ALBUMIN 2.1*   No results found for this basename: LIPASE, AMYLASE,  in the last 168 hours No results found for this basename: AMMONIA,  in the last 168 hours CBC:  Recent Labs Lab 07/23/13 1958 07/24/13 0409 07/25/13 0545  WBC 10.7* 9.6 7.7  NEUTROABS 8.9*  --   --   HGB 9.0* 9.1* 10.1*  HCT 29.5* 30.1* 32.1*  MCV 76.8* 77.4* 76.2*  PLT 450* 487* 377   Cardiac Enzymes: No results found for  this basename: CKTOTAL, CKMB, CKMBINDEX, TROPONINI,  in the last 168 hours BNP (last 3 results) No results found for this basename: PROBNP,  in the last 8760 hours CBG: No results found for this basename: GLUCAP,  in the last 168 hours  No results found for this or any previous visit (from the past 240 hour(s)).   Studies: Dg Chest 1 View  07/23/2013   CLINICAL DATA:  Fall, severe right hip in knee pain, rheumatoid arthritis and COPD  EXAM: CHEST - 1 VIEW  COMPARISON:  Prior chest CT 01/29/2012; prior chest x-ray 01/29/2012  FINDINGS: Cardiac and mediastinal contours within normal limits for size. No focal airspace consolidation, pneumothorax or pleural  effusion. Diffuse central bronchitic changes and mild interstitial prominence. Unremarkable visualized upper abdominal bowel gas pattern.  IMPRESSION: Stable chest x-ray without evidence of acute cardiopulmonary process.   Electronically Signed   By: Malachy Moan M.D.   On: 07/23/2013 19:20   Dg Pelvis 1-2 Views  07/23/2013   CLINICAL DATA:  Fall.  Right pelvic and hip pain.  EXAM: PELVIS - 1-2 VIEW  COMPARISON:  CT on 01/30/2012  FINDINGS: Acute fracture of the right femoral neck is seen. No evidence of hip dislocation.  Fracture deformity of the left pubic bone is seen which appears chronic. Multiple surgical clips and staples are seen in the left pelvis. No acute pelvic fracture identified.  IMPRESSION: Acute right femoral neck fracture.  Old fracture deformity of the left pubis. No acute pelvic fracture identified.   Electronically Signed   By: Myles Rosenthal M.D.   On: 07/23/2013 19:06   Dg Femur Right  07/23/2013   CLINICAL DATA:  Fall.  Right hip and knee pain.  EXAM: RIGHT FEMUR - 2 VIEW  COMPARISON:  None.  FINDINGS: Mildly displaced right femoral neck fracture is seen. No distal femur fracture identified.  IMPRESSION: Mildly displaced right femoral neck fracture.   Electronically Signed   By: Myles Rosenthal M.D.   On: 07/23/2013 19:07   Dg Knee Complete 4 Views Right  07/23/2013   CLINICAL DATA:  Right knee pain after fall.  EXAM: RIGHT KNEE - COMPLETE 4+ VIEW  COMPARISON:  June 21, 2011.  FINDINGS: Mild suprapatellar joint effusion is noted. There appears to be a comminuted fracture involving the articular surface of the medial femoral condyle. No dislocation is noted on the lateral radiograph.  IMPRESSION: Comminuted fracture seen involving the medial femoral condyle. Mild joint effusion is noted.   Electronically Signed   By: Roque Lias M.D.   On: 07/23/2013 16:43    Scheduled Meds: . ferrous sulfate  325 mg Oral Q breakfast  . methocarbamol  500 mg Oral TID  . multivitamin with  minerals  1 tablet Oral Daily  . nicotine  14 mg Transdermal Daily  . pantoprazole  40 mg Oral Daily  . potassium chloride SA  10 mEq Oral Daily  . potassium chloride  20 mEq Oral BID   Continuous Infusions: . sodium chloride 10 mL/hr at 07/24/13 7672    Principal Problem:   Hip fracture Active Problems:   Tobacco abuse   COPD (chronic obstructive pulmonary disease) with chronic bronchitis   Microcytic anemia   Pulmonary nodule, left   Femoral neck fracture   Rheumatoid arthritis   Knee fracture, right    Time spent: 30 minutes    Lesle Chris [facility]  [facility] Pager [phone]. If 7PM-7AM, please contact night-coverage at [email or web address], password [facility] 07/25/2013, 8:19 AM  LOS: 2 days

## 2013-07-25 NOTE — Progress Notes (Signed)
Patient seen, independently examined and chart reviewed. I agree with exam, assessment and plan discussed with Toya Smothers, NP.  Subjective: He feels better today. Pain is controlled.  Objective: Afebrile, vital signs are stable. No hypoxia. Appears calm and comfortable. Speech fluent and clear. Cardiovascular. Regular rate and rhythm. No murmur, rub or gallop. Respiratory clear to auscultation bilaterally. No wheezes, rales or rhonchi. Normal respiratory effort.  Potassium 3.6. Iron low. Hemoglobin stable 10.1. EKG sinus rhythm, cannot rule out septal infarct, age unknown.  Overall appears better today. Pain control. Replete potassium today. Operative repair tomorrow.  Brendia Sacks, MD [facility] [phone]  Summary:  58 year old man with history of recurrent redness presented with history of right hip and knee pain status post fall. Imaging revealed fractures of the right medial femoral condyle and a right femoral neck.   PMH  RA maintained on methotrexate; steroids?  COPD  Chronic LE edema  Pulmonary nodule  Anemia of chronic disease?  Tobacco dependence.

## 2013-07-25 NOTE — Progress Notes (Signed)
Subjective: SURGERY SCHEDULED FOR Friday GIVE SOME POTASSIUM     Recent Labs  07/23/13 1958 07/24/13 0409 07/25/13 0545  HGB 9.0* 9.1* 10.1*    Recent Labs  07/24/13 0409 07/24/13 0908 07/25/13 0545  WBC 9.6  --  7.7  RBC 3.89* 3.82* 4.21*  HCT 30.1*  --  32.1*  PLT 487*  --  377    Recent Labs  07/24/13 0409 07/25/13 0545  NA 140 134*  K 4.0 3.6*  CL 100 98  CO2 26 28  BUN 8 9  CREATININE 0.59 0.50  GLUCOSE 95 126*  CALCIUM 8.2* 7.8*    Recent Labs  07/23/13 1958  INR 1.21      Assessment/Plan: REPLACE POTASSIUM  SURGERY FRIDAY   Vickki Hearing 07/25/2013, 7:36 AM

## 2013-07-26 ENCOUNTER — Encounter (HOSPITAL_COMMUNITY): Payer: Worker's Compensation | Admitting: Anesthesiology

## 2013-07-26 ENCOUNTER — Encounter (HOSPITAL_COMMUNITY)
Admission: EM | Disposition: A | Discharge status: Skilled Nursing Facility | Payer: Self-pay | Source: Home / Self Care | Attending: Family Medicine

## 2013-07-26 ENCOUNTER — Inpatient Hospital Stay (HOSPITAL_COMMUNITY): Payer: Worker's Compensation | Admitting: Anesthesiology

## 2013-07-26 ENCOUNTER — Inpatient Hospital Stay (HOSPITAL_COMMUNITY): Payer: Worker's Compensation

## 2013-07-26 HISTORY — PX: TOTAL HIP ARTHROPLASTY: SHX124

## 2013-07-26 LAB — CBC
HEMATOCRIT: 33.1 % — AB (ref 39.0–52.0)
HEMOGLOBIN: 10.4 g/dL — AB (ref 13.0–17.0)
MCH: 24 pg — ABNORMAL LOW (ref 26.0–34.0)
MCHC: 31.4 g/dL (ref 30.0–36.0)
MCV: 76.4 fL — ABNORMAL LOW (ref 78.0–100.0)
Platelets: 428 10*3/uL — ABNORMAL HIGH (ref 150–400)
RBC: 4.33 MIL/uL (ref 4.22–5.81)
RDW: 18.4 % — ABNORMAL HIGH (ref 11.5–15.5)
WBC: 8 10*3/uL (ref 4.0–10.5)

## 2013-07-26 LAB — BASIC METABOLIC PANEL
BUN: 10 mg/dL (ref 6–23)
CHLORIDE: 99 meq/L (ref 96–112)
CO2: 28 meq/L (ref 19–32)
Calcium: 8.2 mg/dL — ABNORMAL LOW (ref 8.4–10.5)
Creatinine, Ser: 0.51 mg/dL (ref 0.50–1.35)
GFR calc Af Amer: >90 mL/min (ref >90)
GLUCOSE: 100 mg/dL — AB (ref 70–99)
POTASSIUM: 4.7 meq/L (ref 3.7–5.3)
SODIUM: 137 meq/L (ref 137–147)

## 2013-07-26 SURGERY — ARTHROPLASTY, HIP, TOTAL,POSTERIOR APPROACH
Anesthesia: Monitor Anesthesia Care | Site: Hip | Laterality: Right

## 2013-07-26 MED ORDER — METOCLOPRAMIDE HCL 5 MG/ML IJ SOLN: 5.0000 mg | Freq: Three times a day (TID) | INTRAMUSCULAR | Status: DC | PRN

## 2013-07-26 MED ORDER — FENTANYL CITRATE 0.05 MG/ML IJ SOLN
INTRAMUSCULAR | Status: AC
  Filled 2013-07-26: qty 2

## 2013-07-26 MED ORDER — DOCUSATE SODIUM 100 MG PO CAPS
100.0000 mg | ORAL_CAPSULE | Freq: Two times a day (BID) | ORAL | Status: DC
  Administered 2013-07-26 – 2013-08-01 (×12): 100 mg via ORAL
  Filled 2013-07-26 (×12): qty 1

## 2013-07-26 MED ORDER — METOCLOPRAMIDE HCL 10 MG PO TABS: 5.0000 mg | ORAL_TABLET | Freq: Three times a day (TID) | ORAL | Status: DC | PRN

## 2013-07-26 MED ORDER — MAGNESIUM CITRATE PO SOLN: 1.0000 | Freq: Once | ORAL | Status: AC | PRN

## 2013-07-26 MED ORDER — PROPOFOL INFUSION 10 MG/ML OPTIME
INTRAVENOUS | Status: DC | PRN
  Administered 2013-07-26: 75 ug/kg/min via INTRAVENOUS
  Administered 2013-07-26: 15:00:00 via INTRAVENOUS

## 2013-07-26 MED ORDER — SENNA 8.6 MG PO TABS
1.0000 | ORAL_TABLET | Freq: Two times a day (BID) | ORAL | Status: DC
  Administered 2013-07-26 – 2013-08-01 (×12): 8.6 mg via ORAL
  Filled 2013-07-26 (×12): qty 1

## 2013-07-26 MED ORDER — BUPIVACAINE-EPINEPHRINE 0.5% -1:200000 IJ SOLN
INTRAMUSCULAR | Status: DC | PRN
  Administered 2013-07-26 (×2): 30 mL

## 2013-07-26 MED ORDER — BUPIVACAINE IN DEXTROSE 0.75-8.25 % IT SOLN
INTRATHECAL | Status: DC | PRN
  Administered 2013-07-26: 13 mg via INTRATHECAL

## 2013-07-26 MED ORDER — MIDAZOLAM HCL 2 MG/2ML IJ SOLN
1.0000 mg | INTRAMUSCULAR | Status: DC | PRN
  Administered 2013-07-26: 1 mg via INTRAVENOUS

## 2013-07-26 MED ORDER — CEFAZOLIN SODIUM 1-5 GM-% IV SOLN
1.0000 g | Freq: Four times a day (QID) | INTRAVENOUS | Status: AC
  Administered 2013-07-26 (×2): 1 g via INTRAVENOUS
  Filled 2013-07-26 (×2): qty 50

## 2013-07-26 MED ORDER — FENTANYL CITRATE 0.05 MG/ML IJ SOLN: 25.0000 ug | INTRAMUSCULAR | Status: DC | PRN

## 2013-07-26 MED ORDER — MIDAZOLAM HCL 5 MG/5ML IJ SOLN
INTRAMUSCULAR | Status: DC | PRN
  Administered 2013-07-26 (×2): 1 mg via INTRAVENOUS

## 2013-07-26 MED ORDER — PROPOFOL 10 MG/ML IV EMUL
INTRAVENOUS | Status: AC
  Filled 2013-07-26: qty 20

## 2013-07-26 MED ORDER — SODIUM CHLORIDE 0.9 % IR SOLN
Status: DC | PRN
  Administered 2013-07-26: 3000 mL

## 2013-07-26 MED ORDER — METHOCARBAMOL 1000 MG/10ML IJ SOLN
500.0000 mg | Freq: Four times a day (QID) | INTRAVENOUS | Status: DC | PRN
  Filled 2013-07-26: qty 5

## 2013-07-26 MED ORDER — CEFAZOLIN SODIUM-DEXTROSE 2-3 GM-% IV SOLR
2.0000 g | INTRAVENOUS | Status: AC
  Administered 2013-07-26: 2 g via INTRAVENOUS
  Filled 2013-07-26: qty 50

## 2013-07-26 MED ORDER — EPHEDRINE SULFATE 50 MG/ML IJ SOLN
INTRAMUSCULAR | Status: DC | PRN
  Administered 2013-07-26 (×2): 10 mg via INTRAVENOUS

## 2013-07-26 MED ORDER — SODIUM CHLORIDE 0.9 % IR SOLN
Status: DC | PRN
  Administered 2013-07-26: 1000 mL

## 2013-07-26 MED ORDER — LIDOCAINE HCL (PF) 1 % IJ SOLN
INTRAMUSCULAR | Status: AC
  Filled 2013-07-26: qty 5

## 2013-07-26 MED ORDER — ONDANSETRON HCL 4 MG/2ML IJ SOLN: 4.0000 mg | Freq: Once | INTRAMUSCULAR | Status: DC | PRN

## 2013-07-26 MED ORDER — ASPIRIN EC 325 MG PO TBEC
325.0000 mg | DELAYED_RELEASE_TABLET | Freq: Every day | ORAL | Status: DC
  Administered 2013-07-27 – 2013-08-01 (×6): 325 mg via ORAL
  Filled 2013-07-26 (×6): qty 1

## 2013-07-26 MED ORDER — EPHEDRINE SULFATE 50 MG/ML IJ SOLN
INTRAMUSCULAR | Status: AC
  Filled 2013-07-26: qty 1

## 2013-07-26 MED ORDER — SODIUM CHLORIDE BACTERIOSTATIC 0.9 % IJ SOLN
INTRAMUSCULAR | Status: AC
  Filled 2013-07-26: qty 20

## 2013-07-26 MED ORDER — ONDANSETRON HCL 4 MG/2ML IJ SOLN
4.0000 mg | Freq: Once | INTRAMUSCULAR | Status: AC
  Administered 2013-07-26: 4 mg via INTRAVENOUS
  Filled 2013-07-26: qty 2

## 2013-07-26 MED ORDER — METHOCARBAMOL 500 MG PO TABS
500.0000 mg | ORAL_TABLET | Freq: Four times a day (QID) | ORAL | Status: DC | PRN
  Administered 2013-07-26 – 2013-07-30 (×10): 500 mg via ORAL
  Filled 2013-07-26 (×9): qty 1

## 2013-07-26 MED ORDER — ACETAMINOPHEN 325 MG PO TABS: 650.0000 mg | ORAL_TABLET | Freq: Four times a day (QID) | ORAL | Status: DC | PRN

## 2013-07-26 MED ORDER — BUPIVACAINE-EPINEPHRINE (PF) 0.25% -1:200000 IJ SOLN
INTRAMUSCULAR | Status: AC
  Filled 2013-07-26: qty 60

## 2013-07-26 MED ORDER — ALBUTEROL SULFATE (2.5 MG/3ML) 0.083% IN NEBU
2.5000 mg | INHALATION_SOLUTION | Freq: Once | RESPIRATORY_TRACT | Status: AC
  Administered 2013-07-26: 2.5 mg via RESPIRATORY_TRACT

## 2013-07-26 MED ORDER — DIPHENHYDRAMINE HCL 12.5 MG/5ML PO ELIX
12.5000 mg | ORAL_SOLUTION | ORAL | Status: DC | PRN
  Administered 2013-07-27 – 2013-08-01 (×3): 25 mg via ORAL
  Filled 2013-07-26 (×3): qty 10

## 2013-07-26 MED ORDER — GLYCOPYRROLATE 0.2 MG/ML IJ SOLN
0.2000 mg | Freq: Once | INTRAMUSCULAR | Status: AC
  Administered 2013-07-26: 0.2 mg via INTRAVENOUS
  Filled 2013-07-26: qty 1

## 2013-07-26 MED ORDER — LACTATED RINGERS IV SOLN
INTRAVENOUS | Status: DC
  Administered 2013-07-26: 1000 mL via INTRAVENOUS

## 2013-07-26 MED ORDER — POLYETHYLENE GLYCOL 3350 17 G PO PACK: 17.0000 g | PACK | Freq: Every day | ORAL | Status: DC | PRN

## 2013-07-26 MED ORDER — ONDANSETRON HCL 4 MG PO TABS: 4.0000 mg | ORAL_TABLET | Freq: Four times a day (QID) | ORAL | Status: DC | PRN

## 2013-07-26 MED ORDER — PHENYLEPHRINE HCL 10 MG/ML IJ SOLN
INTRAMUSCULAR | Status: AC
  Filled 2013-07-26: qty 1

## 2013-07-26 MED ORDER — MENTHOL 3 MG MT LOZG: 1.0000 | LOZENGE | OROMUCOSAL | Status: DC | PRN

## 2013-07-26 MED ORDER — CHLORHEXIDINE GLUCONATE 4 % EX LIQD: 60.0000 mL | Freq: Once | CUTANEOUS | Status: DC

## 2013-07-26 MED ORDER — ONDANSETRON HCL 4 MG/2ML IJ SOLN: 4.0000 mg | Freq: Four times a day (QID) | INTRAMUSCULAR | Status: DC | PRN

## 2013-07-26 MED ORDER — ALUM & MAG HYDROXIDE-SIMETH 200-200-20 MG/5ML PO SUSP: 30.0000 mL | ORAL | Status: DC | PRN

## 2013-07-26 MED ORDER — LIDOCAINE HCL (CARDIAC) 10 MG/ML IV SOLN
INTRAVENOUS | Status: DC | PRN
  Administered 2013-07-26: 50 mg via INTRAVENOUS

## 2013-07-26 MED ORDER — ALBUTEROL SULFATE (2.5 MG/3ML) 0.083% IN NEBU
INHALATION_SOLUTION | RESPIRATORY_TRACT | Status: AC
  Filled 2013-07-26: qty 3

## 2013-07-26 MED ORDER — MIDAZOLAM HCL 2 MG/2ML IJ SOLN
INTRAMUSCULAR | Status: AC
  Filled 2013-07-26: qty 2

## 2013-07-26 MED ORDER — FENTANYL CITRATE 0.05 MG/ML IJ SOLN
INTRAMUSCULAR | Status: DC | PRN
  Administered 2013-07-26: 25 ug via INTRAVENOUS
  Administered 2013-07-26: 50 ug via INTRAVENOUS
  Administered 2013-07-26: 20 ug via INTRATHECAL
  Administered 2013-07-26: 25 ug via INTRAVENOUS
  Administered 2013-07-26: 30 ug via INTRAVENOUS

## 2013-07-26 MED ORDER — PHENOL 1.4 % MT LIQD: 1.0000 | OROMUCOSAL | Status: DC | PRN

## 2013-07-26 MED ORDER — BUPIVACAINE-EPINEPHRINE (PF) 0.5% -1:200000 IJ SOLN
INTRAMUSCULAR | Status: AC
  Filled 2013-07-26: qty 60

## 2013-07-26 MED ORDER — POTASSIUM CHLORIDE IN NACL 20-0.9 MEQ/L-% IV SOLN
INTRAVENOUS | Status: DC
  Administered 2013-07-26 (×2): via INTRAVENOUS

## 2013-07-26 MED ORDER — SODIUM CHLORIDE BACTERIOSTATIC 0.9 % IJ SOLN
INTRAMUSCULAR | Status: AC
  Filled 2013-07-26: qty 10

## 2013-07-26 MED ORDER — BUPIVACAINE IN DEXTROSE 0.75-8.25 % IT SOLN
INTRATHECAL | Status: AC
  Filled 2013-07-26: qty 2

## 2013-07-26 MED ORDER — ACETAMINOPHEN 650 MG RE SUPP: 650.0000 mg | Freq: Four times a day (QID) | RECTAL | Status: DC | PRN

## 2013-07-26 MED ORDER — PHENYLEPHRINE HCL 10 MG/ML IJ SOLN
INTRAMUSCULAR | Status: DC | PRN
  Administered 2013-07-26 (×3): 100 ug via INTRAVENOUS

## 2013-07-26 MED ORDER — FENTANYL CITRATE 0.05 MG/ML IJ SOLN
25.0000 ug | INTRAMUSCULAR | Status: DC
  Administered 2013-07-26: 25 ug via INTRAVENOUS

## 2013-07-26 MED ORDER — LACTATED RINGERS IV SOLN
INTRAVENOUS | Status: DC | PRN
  Administered 2013-07-26 (×2): via INTRAVENOUS

## 2013-07-26 MED ORDER — BISACODYL 10 MG RE SUPP: 10.0000 mg | Freq: Every day | RECTAL | Status: DC | PRN

## 2013-07-26 SURGICAL SUPPLY — 69 items
BIT DRILL 2.8X128 (BIT) ×2 IMPLANT
BIT DRILL 2.8X128MM (BIT) ×1
BLADE HEX COATED 2.75 (ELECTRODE) ×6 IMPLANT
BLADE SAGITTAL 25.0X1.27X90 (BLADE) ×2 IMPLANT
BLADE SAGITTAL 25.0X1.27X90MM (BLADE) ×1
BOWL SMART MIX CTS (DISPOSABLE) IMPLANT
CAPT HIP PF MOP ×3 IMPLANT
CLOTH BEACON ORANGE TIMEOUT ST (SAFETY) ×3 IMPLANT
COVER LIGHT HANDLE STERIS (MISCELLANEOUS) ×6 IMPLANT
COVER PROBE W GEL 5X96 (DRAPES) ×3 IMPLANT
DECANTER SPIKE VIAL GLASS SM (MISCELLANEOUS) ×6 IMPLANT
DRAPE BACK TABLE (DRAPES) ×3 IMPLANT
DRAPE HIP W/POCKET STRL (DRAPE) ×3 IMPLANT
DRAPE U-SHAPE 47X51 STRL (DRAPES) ×3 IMPLANT
DRSG MEPILEX BORDER 4X12 (GAUZE/BANDAGES/DRESSINGS) ×3 IMPLANT
DURAPREP 26ML APPLICATOR (WOUND CARE) ×6 IMPLANT
ELECT REM PT RETURN 9FT ADLT (ELECTROSURGICAL) ×3
ELECTRODE REM PT RTRN 9FT ADLT (ELECTROSURGICAL) ×1 IMPLANT
EVACUATOR 3/16  PVC DRAIN (DRAIN)
EVACUATOR 3/16 PVC DRAIN (DRAIN) IMPLANT
FACESHIELD OPICON STD (MASK) ×3 IMPLANT
GLOVE BIOGEL PI IND STRL 7.0 (GLOVE) ×4 IMPLANT
GLOVE BIOGEL PI IND STRL 7.5 (GLOVE) ×1 IMPLANT
GLOVE BIOGEL PI INDICATOR 7.0 (GLOVE) ×8
GLOVE BIOGEL PI INDICATOR 7.5 (GLOVE) ×2
GLOVE ECLIPSE 7.0 STRL STRAW (GLOVE) ×6 IMPLANT
GLOVE EXAM NITRILE MD LF STRL (GLOVE) ×9 IMPLANT
GLOVE OPTIFIT SS 8.0 STRL (GLOVE) ×3 IMPLANT
GLOVE SKINSENSE NS SZ8.0 LF (GLOVE) ×4
GLOVE SKINSENSE STRL SZ8.0 LF (GLOVE) ×2 IMPLANT
GLOVE SS BIOGEL STRL SZ 6.5 (GLOVE) ×2 IMPLANT
GLOVE SS N UNI LF 8.5 STRL (GLOVE) ×3 IMPLANT
GLOVE SUPERSENSE BIOGEL SZ 6.5 (GLOVE) ×4
GOWN STRL REUS W/TWL LRG LVL3 (GOWN DISPOSABLE) ×9 IMPLANT
GOWN STRL REUS W/TWL XL LVL3 (GOWN DISPOSABLE) ×3 IMPLANT
HANDPIECE INTERPULSE COAX TIP (DISPOSABLE) ×2
HOOD W/PEELAWAY (MISCELLANEOUS) ×12 IMPLANT
INST SET MAJOR BONE (KITS) ×3 IMPLANT
IV NS IRRIG 3000ML ARTHROMATIC (IV SOLUTION) ×3 IMPLANT
KIT BLADEGUARD II DBL (SET/KITS/TRAYS/PACK) ×3 IMPLANT
KIT ROOM TURNOVER APOR (KITS) ×3 IMPLANT
MANIFOLD NEPTUNE II (INSTRUMENTS) ×3 IMPLANT
MARKER SKIN DUAL TIP RULER LAB (MISCELLANEOUS) ×3 IMPLANT
NEEDLE HYPO 18GX1.5 BLUNT FILL (NEEDLE) ×6 IMPLANT
NEEDLE HYPO 21X1.5 SAFETY (NEEDLE) ×3 IMPLANT
NEEDLE HYPO 25X1 1.5 SAFETY (NEEDLE) ×3 IMPLANT
NS IRRIG 1000ML POUR BTL (IV SOLUTION) ×3 IMPLANT
PACK TOTAL JOINT (CUSTOM PROCEDURE TRAY) ×3 IMPLANT
PAD ARMBOARD 7.5X6 YLW CONV (MISCELLANEOUS) ×3 IMPLANT
PILLOW HIP ABDUCTION MED (ORTHOPEDIC SUPPLIES) ×3 IMPLANT
PIN STMN 9X.142 IN (PIN) ×6 IMPLANT
SET BASIN LINEN APH (SET/KITS/TRAYS/PACK) ×3 IMPLANT
SET HNDPC FAN SPRY TIP SCT (DISPOSABLE) ×1 IMPLANT
SPONGE LAP 18X18 X RAY DECT (DISPOSABLE) ×3 IMPLANT
STAPLER VISISTAT 35W (STAPLE) ×3 IMPLANT
SUT BRALON NAB BRD #1 30IN (SUTURE) ×6 IMPLANT
SUT ETHIBOND 5 LR DA (SUTURE) ×6 IMPLANT
SUT MNCRL 0 VIOLET CTX 36 (SUTURE) ×1 IMPLANT
SUT MON AB 2-0 CT1 36 (SUTURE) ×3 IMPLANT
SUT MONOCRYL 0 CTX 36 (SUTURE) ×2
SUT VIC AB 1 CT1 27 (SUTURE) ×4
SUT VIC AB 1 CT1 27XBRD ANTBC (SUTURE) ×2 IMPLANT
SYR 20CC LL (SYRINGE) ×3 IMPLANT
SYR 30ML LL (SYRINGE) ×3 IMPLANT
SYR BULB IRRIGATION 50ML (SYRINGE) ×3 IMPLANT
TOWEL OR 17X26 4PK STRL BLUE (TOWEL DISPOSABLE) ×3 IMPLANT
TOWER CARTRIDGE SMART MIX (DISPOSABLE) IMPLANT
TRAY FOLEY CATH 16FR SILVER (SET/KITS/TRAYS/PACK) ×3 IMPLANT
YANKAUER SUCT 12FT TUBE ARGYLE (SUCTIONS) ×6 IMPLANT

## 2013-07-26 NOTE — Anesthesia Preprocedure Evaluation (Signed)
Anesthesia Evaluation  Patient identified by MRN, date of birth, ID band Patient awake    Reviewed: Allergy & Precautions, H&P , NPO status , Patient's Chart, lab work & pertinent test results  Airway Mallampati: III TM Distance: <3 FB Neck ROM: Limited    Dental  (+) Poor Dentition, Missing, Loose, Chipped, Dental Advisory Given   Pulmonary COPD (emphysema, L pulm nodule) COPD inhaler, Current Smoker,  breath sounds clear to auscultation        Cardiovascular negative cardio ROS  Rhythm:Regular Rate:Normal     Neuro/Psych PSYCHIATRIC DISORDERS    GI/Hepatic   Endo/Other    Renal/GU      Musculoskeletal  (+) Arthritis -, Rheumatoid disorders,    Abdominal   Peds  Hematology  (+) Blood dyscrasia, anemia ,   Anesthesia Other Findings   Reproductive/Obstetrics                           Anesthesia Physical Anesthesia Plan  ASA: III  Anesthesia Plan: Spinal and MAC   Post-op Pain Management:    Induction: Intravenous  Airway Management Planned: Simple Face Mask  Additional Equipment:   Intra-op Plan:   Post-operative Plan:   Informed Consent: I have reviewed the patients History and Physical, chart, labs and discussed the procedure including the risks, benefits and alternatives for the proposed anesthesia with the patient or authorized representative who has indicated his/her understanding and acceptance.     Plan Discussed with:   Anesthesia Plan Comments: (Pre op albuterol neb)        Anesthesia Quick Evaluation

## 2013-07-26 NOTE — Care Management Note (Signed)
    Page 1 of 1   08/01/2013     9:33:27 AM CARE MANAGEMENT NOTE 08/01/2013  Patient:  Joe Mccullough, Joe Mccullough   Account Number:  [id number]  Date Initiated:  07/26/2013  Documentation initiated by:  Rosemary Holms  Subjective/Objective Assessment:   Pt from home. Anticipate DC to SNF.     Action/Plan:   Anticipated DC Date:     Anticipated DC Plan:  SKILLED NURSING FACILITY  In-house referral  Clinical Social Worker      DC Planning Services  CM consult      Choice offered to / List presented to:             Status of service:  Completed, signed off Medicare Important Message given?   (If response is "NO", the following Medicare IM given date fields will be blank) Date Medicare IM given:   Date Additional Medicare IM given:    Discharge Disposition:  SKILLED NURSING FACILITY  Per UR Regulation:    If discussed at Long Length of Stay Meetings, dates discussed:    Comments:  08/01/13 0930 Arlyss Queen, RN BSN CM Pt discharged today to [facility] in [place]. CSW to arrange discharge to facility.  07/26/13 Rosemary Holms RN BSN CM

## 2013-07-26 NOTE — Progress Notes (Signed)
Finalized Advance Directives with the patient. Placed a copy in chart and also provided his requested copies.

## 2013-07-26 NOTE — Transfer of Care (Signed)
Immediate Anesthesia Transfer of Care Note  Patient: Joe Mccullough.  Procedure(s) Performed: Procedure(s): TOTAL HIP ARTHROPLASTY (Right)  Patient Location: PACU  Anesthesia Type:Spinal  Level of Consciousness: sedated and patient cooperative  Airway & Oxygen Therapy: Patient Spontanous Breathing and Patient connected to face mask oxygen  Post-op Assessment: Report given to PACU RN and Post -op Vital signs reviewed and stable  Post vital signs: Reviewed and stable  Complications: No apparent anesthesia complications

## 2013-07-26 NOTE — Progress Notes (Signed)
Patient seen, independently examined and chart reviewed. I agree with exam, assessment and plan discussed with Toya Smothers, NP.  Subjective: Overall feels okay. Does have pain. Status post surgery.  Objective: Appears calm and comfortable. Speech fluent and clear. Cardiovascular regular rate and rhythm. No murmur, rub or gallop. Respiratory clear to auscultation bilaterally. No wheezes, rales or rhonchi. Normal respiratory effort. Psychiatric carcinoma and affect. Speech fluent and appropriate.  Potassium corrected, 4.7. Basic metabolic panel unremarkable. Hemoglobin stable 10.4.  Appears to be stable postoperatively. Repeat CBC and basic metabolic panel the morning. Continue current care.  Brendia Sacks, MD [facility] [phone]  Summary:  58 year old man with history of recurrent redness presented with history of right hip and knee pain status post fall. Imaging revealed fractures of the right medial femoral condyle and a right femoral neck.  PMH  RA maintained on methotrexate; steroids?  COPD  Chronic LE edema  Pulmonary nodule  Anemia of chronic disease?  Tobacco dependence.

## 2013-07-26 NOTE — Op Note (Addendum)
Preop right femoral neck fracture  Postop diagnosis same  Procedure right total hip arthroplasty  Surgeon Romeo Apple  Assisted by [facility] and [facility]  Anesthesia spinal  Estimated blood loss 250 cc  Complications none  Drains none  Operative findings complete femoral neck fracture with displacement. Soft bone. Presumably secondary to rheumatoid arthritis and chronic steroid use and methotrexate use.  Prior to the procedure a right knee film was placed in traction there was no knee fracture. 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 The patient was taken to the operating room.  After spinal anesthetic the patient was placed in lateral decubitus position with the right side up. After sterile prep and drape and timeout the incision was made over the greater trochanter.  This incision was carried down to the fascia which was split in line with the skin incision down to the gluteus medius.  Blunt dissection was carried out in line with the fibers of the gluteus medius and the anterior half was removed from the greater trochanter with subperiosteal dissection including the gluteus minimus. This was retracted proximally and held in place with 2 Steinmann pins. A capsulectomy was performed. We encountered a hip fracture. The femoral head was removed. The acetabulum was inspected and found to have fairly healthy cartilage.  The hip was dislocated anteriorly proximal femoral preparation was performed per technique. Serial broaching to carried out up to a size 6 Tri-Lock femoral stem.  Leg was then placed in extension dissection was carried out to expose the acetabulum anterior and posterior retractors were placed. The acetabular ligament was identified. Serial reaming was performed starting with 47 up to size 50. Femoral head bone graft was used to graft the  acetabulum. And then a size 50 cup was placed along with 2 screws which were placed per technique.  A trial liner was placed along with a high offset stem neck attachment and a 32 head. We also trialed with a regular offset 32 head and this gave the best stability and motion. The hip was stable in all planes.  Good tension was noted on the abductors.  The trial components were removed. The size 6 femoral stem was placed. The 32 ball was placed along with the 32 inner diameter liner for the 50 cup. Repeat trial reduction confirmed stability  Drill holes were placed in the femur and 2 #5 Tycron sutures were passed. This was used to repair the gluteus minimus and medius.  Thorough irrigation was performed the deep tissues were injected with Marcaine with epinephrine 30 cc.  The abductors were repaired with the leg in slight internal rotation.  The fascia was closed with #1 Bralon in running fashion. Subcutaneous tissues were closed with 0 Monocryl. Staples were used to reapproximate the skin  Sterile dressing was applied an abduction pillow was placed  The patient is full weightbearing as tolerated  Implants Pinnacle cancellus bone screws #25x2  Tri-Lock femoral stem standard offset size 6  Pinnacle altrex polyethylene  +4 neutral liner. 32 x 50   apex hole eliminator, Gription acetabular sector 50 mm outer diameter cup

## 2013-07-26 NOTE — Anesthesia Procedure Notes (Addendum)
Date/Time: 07/26/2013 12:42 PM Performed by: Pernell Dupre, Boleslaus Holloway A Pre-anesthesia Checklist: Patient identified, Timeout performed, Emergency Drugs available, Suction available and Patient being monitored Oxygen Delivery Method: Circle system utilized   Spinal  Patient location during procedure: OR Start time: 07/26/2013 1:13 PM Staffing CRNA/Resident: Romone Shaff A Preanesthetic Checklist Completed: patient identified, site marked, surgical consent, pre-op evaluation, timeout performed, IV checked, risks and benefits discussed and monitors and equipment checked Spinal Block Patient position: right lateral decubitus Prep: Betadine Patient monitoring: heart rate, cardiac monitor, continuous pulse ox and blood pressure Approach: right paramedian Location: L3-4 Injection technique: single-shot Needle Needle type: Spinocan  Needle gauge: 22 G Needle length: 9 cm Assessment Sensory level: T8 Additional Notes ATTEMPTS:1 TRAY TS:[id number] TRAY EXPIRATION DATE:03/2014 Bupivacaine 13mg , fentanyl , epi.1 injected intrathecally at 1313; patient tolerated well.

## 2013-07-26 NOTE — Brief Op Note (Signed)
07/23/2013 - 07/26/2013  3:38 PM  PATIENT:  Joe Mccullough.  58 y.o. male  PRE-OPERATIVE DIAGNOSIS:  FRACTURE RIGHT HIP  POST-OPERATIVE DIAGNOSIS:  FRACTURE RIGHT HIP  PROCEDURE:  Procedure(s): TOTAL HIP ARTHROPLASTY (Right)  SURGEON:  Surgeon(s) and Role:    * Vickki Hearing, MD - Primary  PHYSICIAN ASSISTANT:   ASSISTANTS: betty ashley and holly proztek    ANESTHESIA:   spinal  EBL:  Total I/O In: 1800 [I.V.:1800] Out: 1450 [Urine:1200; Blood:250]  BLOOD ADMINISTERED:none  DRAINS: none   LOCAL MEDICATIONS USED:  MARCAINE   , Amount: 60 ml and OTHER with epi   SPECIMEN:  No Specimen  DISPOSITION OF SPECIMEN:  N/A  COUNTS:  YES  TOURNIQUET:  * No tourniquets in log *  DICTATION: .Dragon Dictation  PLAN OF CARE: Admit to inpatient   PATIENT DISPOSITION:  PACU - hemodynamically stable.   Delay start of Pharmacological VTE agent (>24hrs) due to surgical blood loss or risk of bleeding: yes

## 2013-07-26 NOTE — Anesthesia Postprocedure Evaluation (Signed)
  Anesthesia Post-op Note  Patient: Joe Mccullough.  Procedure(s) Performed: Procedure(s): TOTAL HIP ARTHROPLASTY (Right)  Patient Location: PACU  Anesthesia Type:Spinal  Level of Consciousness: sedated and patient cooperative  Airway and Oxygen Therapy: Patient connected to face mask oxygen  Post-op Pain: none  Post-op Assessment: Post-op Vital signs reviewed, Patient's Cardiovascular Status Stable, Respiratory Function Stable, Patent Airway, No signs of Nausea or vomiting and Pain level controlled  Post-op Vital Signs: Reviewed and stable  Last Vitals:  Filed Vitals:   07/26/13 1245  BP: 131/79  Pulse:   Temp:   Resp: 55    Complications: No apparent anesthesia complications

## 2013-07-26 NOTE — Progress Notes (Signed)
[facility] PROGRESS NOTE  Joe Mccullough. BHA:[id number] DOB: [birth year] DOA: 07/23/2013 PCP: Colette Ribas, MD  Summary: 58 year old man with history of recurrent redness presented with history of right hip and knee pain status post fall. Imaging revealed fractures of the right medial femoral condyle and a right femoral neck. S/p 1 unit PRBC's preoperatively.    Assessment/Plan: 1. Right hip fx: xray with right femoral neck fx after mechanical fall. Dr. Romeo Apple with ortho managing. Surgery today. Adequate pain management.  2. Right knee fx: after mechanical fall. Defer management to ortho. See #1  3. Microcytic anemia: IDA most likely. chart review indicated hx of same. Await FOBT. Anemia panel with iron <10, ferritin 462. Current Hg 10.1 s/p 1 unit PRBC's. Hg 10.4 on day of surgery.  Will benefit from OP GI follow up.  4. Rheumatoid arthritis: with joint deformity. Pt reports working full time. On methotrexate at home. This is on hold for now.  5. COPD: current smoker. BS coarse. Will encourage IS. Appears stable at baseline. Oxygen saturation level 99% on room air morning of surgery.  Monitor  6.hx of pulmonary nodule: chart review indicates CT 2013 noted 9mm LLL nodule. At that time follow up CT 1 year recommended. No documentation that this was done.  7. Tobacco use. Counseled regarding cessation. Nicotine patch provided.  8. Hypokalemia: likely related to lasix. Resolved this am. Home lasix on hold.    1. **  Code Status: full Family Communication: none present Disposition Plan: likely need snf after surgery   Consultants:  Dr Romeo Apple ortho  Procedures:    Antibiotics:  none  HPI/Subjective: Awake alert. Verbalizes appreciation for "how good you all are to me". Reports "ready" for surgery.   Objective: Filed Vitals:   07/26/13 1130  BP: 138/85  Pulse:   Temp:   Resp: 0    Intake/Output Summary (Last 24 hours) at 07/26/13 1136 Last data filed  at 07/26/13 1006  Gross per 24 hour  Intake    240 ml  Output   1000 ml  Net   -760 ml   Filed Weights   07/23/13 1606 07/23/13 2245  Weight: 63.504 kg (140 lb) 56.972 kg (125 lb 9.6 oz)    Exam:   General:  Well nourished NAD  Cardiovascular: RRR No MGR No LE edema on left. Right leg with 1+ LE edema and tenderness to palpation.  Respiratory: normal effort BS somewhat coarse no wheeze or crackles  Abdomen: non-distended non-tender to palpation  Musculoskeletal: right knee with edema and tenderness. Resists rom due to pain. Foot warm to touch. Lying on left side with knees drawn up to chest.    Data Reviewed: Basic Metabolic Panel:  Recent Labs Lab 07/23/13 1958 07/24/13 0409 07/25/13 0545 07/26/13 0517  NA 140 140 134* 137  K 3.9 4.0 3.6* 4.7  CL 101 100 98 99  CO2 25 26 28 28   GLUCOSE 93 95 126* 100*  BUN 10 8 9 10   CREATININE 0.54 0.59 0.50 0.51  CALCIUM 8.2* 8.2* 7.8* 8.2*   Liver Function Tests:  Recent Labs Lab 07/24/13 0409  AST 12  ALT <5  ALKPHOS 123*  BILITOT 0.2*  PROT 7.3  ALBUMIN 2.1*   No results found for this basename: LIPASE, AMYLASE,  in the last 168 hours No results found for this basename: AMMONIA,  in the last 168 hours CBC:  Recent Labs Lab 07/23/13 1958 07/24/13 0409 07/25/13 0545 07/26/13 0517  WBC 10.7* 9.6  7.7 8.0  NEUTROABS 8.9*  --   --   --   HGB 9.0* 9.1* 10.1* 10.4*  HCT 29.5* 30.1* 32.1* 33.1*  MCV 76.8* 77.4* 76.2* 76.4*  PLT 450* 487* 377 428*   Cardiac Enzymes: No results found for this basename: CKTOTAL, CKMB, CKMBINDEX, TROPONINI,  in the last 168 hours BNP (last 3 results) No results found for this basename: PROBNP,  in the last 8760 hours CBG: No results found for this basename: GLUCAP,  in the last 168 hours  Recent Results (from the past 240 hour(s))  SURGICAL PCR SCREEN     Status: None   Collection Time    07/25/13 11:05 AM      Result Value Ref Range Status   MRSA, PCR NEGATIVE  NEGATIVE  Final   Staphylococcus aureus NEGATIVE  NEGATIVE Final   Comment:            The Xpert SA Assay (FDA     approved for NASAL specimens     in patients over 23 years of age),     is one component of     a comprehensive surveillance     program.  Test performance has     been validated by [employer] for patients greater     than or equal to 74 year old.     It is not intended     to diagnose infection nor to     guide or monitor treatment.     Studies: No results found.  Scheduled Meds: .  ceFAZolin (ANCEF) IV  2 g Intravenous On Call to OR  . chlorhexidine  60 mL Topical Once  . fentaNYL  25 mcg Intravenous Q10 min  . [facility] HOLD] ferrous sulfate  325 mg Oral Q breakfast  . [MAR HOLD] methocarbamol  500 mg Oral TID  . [MAR HOLD] multivitamin with minerals  1 tablet Oral Daily  . [facility] HOLD] nicotine  14 mg Transdermal Daily  . [MAR HOLD] pantoprazole  40 mg Oral Daily  . [facility] HOLD] potassium chloride  20 mEq Oral BID   Continuous Infusions: . sodium chloride 10 mL/hr at 07/24/13 0918  . lactated ringers 1,000 mL (07/26/13 1126)    Principal Problem:   Hip fracture Active Problems:   Tobacco abuse   COPD (chronic obstructive pulmonary disease) with chronic bronchitis   Microcytic anemia   Pulmonary nodule, left   Femoral neck fracture   Rheumatoid arthritis   Knee fracture, right    Time spent: 30 minutes    Lesle Chris [facility]  [facility] Pager [phone]. If 7PM-7AM, please contact night-coverage at [email or web address], password [facility] 07/26/2013, 11:36 AM  LOS: 3 days

## 2013-07-27 LAB — BASIC METABOLIC PANEL
BUN: 8 mg/dL (ref 6–23)
CO2: 30 meq/L (ref 19–32)
CREATININE: 0.51 mg/dL (ref 0.50–1.35)
Calcium: 8 mg/dL — ABNORMAL LOW (ref 8.4–10.5)
Chloride: 99 mEq/L (ref 96–112)
GFR calc Af Amer: >90 mL/min (ref >90)
GFR calc non Af Amer: >90 mL/min (ref >90)
GLUCOSE: 104 mg/dL — AB (ref 70–99)
POTASSIUM: 4.7 meq/L (ref 3.7–5.3)
Sodium: 135 mEq/L — ABNORMAL LOW (ref 137–147)

## 2013-07-27 LAB — TYPE AND SCREEN
ABO/RH(D): A POS
Antibody Screen: NEGATIVE
UNIT DIVISION: 0
UNIT DIVISION: 0
Unit division: 0
Unit division: 0
Unit division: 0

## 2013-07-27 LAB — CBC
HCT: 28.9 % — ABNORMAL LOW (ref 39.0–52.0)
HEMOGLOBIN: 8.8 g/dL — AB (ref 13.0–17.0)
MCH: 23.9 pg — ABNORMAL LOW (ref 26.0–34.0)
MCHC: 30.4 g/dL (ref 30.0–36.0)
MCV: 78.5 fL (ref 78.0–100.0)
Platelets: 393 10*3/uL (ref 150–400)
RBC: 3.68 MIL/uL — ABNORMAL LOW (ref 4.22–5.81)
RDW: 18.5 % — ABNORMAL HIGH (ref 11.5–15.5)
WBC: 7.5 10*3/uL (ref 4.0–10.5)

## 2013-07-27 NOTE — Progress Notes (Signed)
Subjective: 1 Day Post-Op Procedure(s) (LRB): TOTAL HIP ARTHROPLASTY (Right) Patient reports pain as mild.    Objective: Vital signs in last 24 hours: Temp:  [97.8 F (36.6 C)-98.7 F (37.1 C)] 98.5 F (36.9 C) (05/02 0427) Pulse Rate:  [25-98] 85 (05/02 0427) Resp:  [0-80] 20 (05/02 0427) BP: (100-153)/(58-104) 125/69 mmHg (05/02 0427) SpO2:  [77 %-100 %] 94 % (05/02 0427)  Intake/Output from previous day: 05/01 0701 - 05/02 0700 In: 2850 [P.O.:850; I.V.:2000] Out: 4250 [Urine:4000; Blood:250] Intake/Output this shift:     Recent Labs  07/25/13 0545 07/26/13 0517 07/27/13 0617  HGB 10.1* 10.4* 8.8*    Recent Labs  07/26/13 0517 07/27/13 0617  WBC 8.0 7.5  RBC 4.33 3.68*  HCT 33.1* 28.9*  PLT 428* 393    Recent Labs  07/26/13 0517 07/27/13 0617  NA 137 135*  K 4.7 4.7  CL 99 99  CO2 28 30  BUN 10 8  CREATININE 0.51 0.51  GLUCOSE 100* 104*  CALCIUM 8.2* 8.0*   No results found for this basename: LABPT, INR,  in the last 72 hours  Neurologically intact Neurovascular intact Sensation intact distally Intact pulses distally Dorsiflexion/Plantar flexion intact Compartment soft  Assessment/Plan: 1 Day Post-Op Procedure(s) (LRB): TOTAL HIP ARTHROPLASTY (Right) Advance diet Up with therapy D/C IV fluids  Joe Mccullough 07/27/2013, 10:31 AM

## 2013-07-27 NOTE — Progress Notes (Signed)
  PROGRESS NOTE  Joe Mccullough. PIR:[id number] DOB: March 10, 1956 DOA: 07/23/2013 PCP: Colette Ribas, MD  Summary: 58 year old man with history of recurrent redness presented with history of right hip and knee pain status post fall. Imaging revealed fractures of the right medial femoral condyle and a right femoral neck.   Assessment/Plan: 1. Fractures right femoral condyle and right femoral neck secondary to mechanical fall. Status postoperative repair 5/1. Management per orthopedics. 2. Microcytic anemia, iron deficient. Decreased postoperatively. Repeat CBC in the morning. Suggest outpatient to follow. 3. COPD. Stable. 4. H/o rheumatoid arthritis. Methotrexate on hold. 5. History of pulmonary nodule. 6. Tobacco dependence.   Continue postoperative management per orthopedics.  DVT prophylaxis deferred to orthopedics.  CBC in the morning.  May need skilled nursing facility placement.  Code Status: full code DVT prophylaxis: SCDs Family Communication: none present Disposition Plan: pending  Brendia Sacks, MD  [facility]  Pager [phone] If 7PM-7AM, please contact night-coverage at [email or web address], password [facility] 07/27/2013, 4:57 PM  LOS: 4 days   Consultants:  Orthopedics  Procedures:  5/1 TOTAL HIP ARTHROPLASTY (Right)  Antibiotics:    HPI/Subjective: Overall feeling better today. Pain control. Hungry. Would like a regular diet.  Objective: Filed Vitals:   07/27/13 0221 07/27/13 0400 07/27/13 0427 07/27/13 1435  BP: 125/74  125/69 109/57  Pulse: 92  85 82  Temp: 98 F (36.7 C)  98.5 F (36.9 C) 98.1 F (36.7 C)  TempSrc: Oral  Oral Oral  Resp: 20 18 20 20   Height:      Weight:      SpO2: 93% 95% 94% 93%    Intake/Output Summary (Last 24 hours) at 07/27/13 1657 Last data filed at 07/27/13 0648  Gross per 24 hour  Intake    850 ml  Output   2350 ml  Net  -1500 ml     Filed Weights   07/23/13 1606 07/23/13 2245  Weight: 63.504 kg  (140 lb) 56.972 kg (125 lb 9.6 oz)    Exam:   Afebrile, vital signs are stable. Hypoxia.  Gen. Appears calm, comfortable. Alert.  Respiratory clear to auscultation bilaterally. No wheezes, rales or rhonchi. Normal respiratory effort.  Cardiovascular. Regular rate and rhythm. No murmur, rub or gallop.  Psychiatric. Grossly normal and affect. Speech fluent and appropriate.  Data Reviewed:  Hemoglobin 8.8.   Basic metabolic panel unremarkable.  Scheduled Meds: . aspirin EC  325 mg Oral Q breakfast  . docusate sodium  100 mg Oral BID  . ferrous sulfate  325 mg Oral Q breakfast  . multivitamin with minerals  1 tablet Oral Daily  . nicotine  14 mg Transdermal Daily  . pantoprazole  40 mg Oral Daily  . senna  1 tablet Oral BID   Continuous Infusions:   Principal Problem:   Hip fracture Active Problems:   Tobacco abuse   COPD (chronic obstructive pulmonary disease) with chronic bronchitis   Microcytic anemia   Pulmonary nodule, left   Femoral neck fracture   Rheumatoid arthritis   Knee fracture, right   Time spent 15 minutes

## 2013-07-27 NOTE — Evaluation (Signed)
Physical Therapy Evaluation Patient Details Name: Joe Mccullough. MRN: [id number] DOB: 1955-07-28 Today's Date: 07/27/2013   History of Present Illness  58 year old man with history of recurrent redness presented with history of right hip and knee pain status post fall. Imaging revealed fractures of the right medial femoral condyle and a right femoral neck. S/p R THP done  Clinical Impression  Patient seen in lying position with R knee flexed sec to chronic contracture , increased c/o pain more on knee than operated hip , increased anxiety noted , however cooperative and motivated , needed max A for mobility , not able to stand greater than 10 sec , patient standing with bilateral knee flexed and decreased ability to position R ankle flat supported on the floor,  Dc plan to SNF which is appropriate at this time.    Follow Up Recommendations SNF    Equipment Recommendations  Rolling walker with 5" wheels    Recommendations for Other Services       Precautions / Restrictions Precautions Precaution Comments: WBAT      Mobility  Bed Mobility Overal bed mobility: Needs Assistance Bed Mobility: Supine to Sit     Supine to sit: Max assist     General bed mobility comments: increased time and HOB elevated  Transfers Overall transfer level: Needs assistance Equipment used: Rolling walker (2 wheeled) Transfers: Sit to/from Stand Sit to Stand: Max assist            Ambulation/Gait             General Gait Details: unable at this time  Stairs            Wheelchair Mobility    Modified Rankin (Stroke Patients Only)       Balance Overall balance assessment: Needs assistance Sitting-balance support: No upper extremity supported Sitting balance-Leahy Scale: Fair     Standing balance support: Bilateral upper extremity supported Standing balance-Leahy Scale: Poor                               Pertinent Vitals/Pain     Home Living  Family/patient expects to be discharged to:: Private residence Living Arrangements: Alone                    Prior Function           Comments: with RW     Hand Dominance        Extremity/Trunk Assessment               Lower Extremity Assessment:  (R knee flexion contracture  at 110 degrees flexed.)         Communication      Cognition Arousal/Alertness: Awake/alert Behavior During Therapy: WFL for tasks assessed/performed Overall Cognitive Status: Within Functional Limits for tasks assessed                      General Comments      Exercises Total Joint Exercises Ankle Circles/Pumps: PROM;Both;5 reps;Seated Knee Flexion: AAROM;Both;5 reps;Seated Goniometric ROM:  R knee flexion contractture to 110 degrees  and left knee with extension lag of 8 degrees.( measured in sitting)      Assessment/Plan    PT Assessment Patient needs continued PT services  PT Diagnosis Difficulty walking;Generalized weakness;Acute pain   PT Problem List Decreased strength;Decreased range of motion;Decreased activity tolerance;Decreased balance;Decreased mobility  PT Treatment Interventions DME instruction;Gait training;Stair  training;Therapeutic exercise;Therapeutic activities;Balance training   PT Goals (Current goals can be found in the Care Plan section) Acute Rehab PT Goals Patient Stated Goal: to be able to walk PT Goal Formulation: With patient Time For Goal Achievement: 08/10/13 Potential to Achieve Goals: Fair    Frequency 7X/week   Barriers to discharge Decreased caregiver support      Co-evaluation               End of Session Equipment Utilized During Treatment: Gait belt Activity Tolerance: Patient limited by fatigue Patient left: in bed Nurse Communication: Mobility status         Time: 1215-1300 PT Time Calculation (min): 45 min   Charges:   PT Evaluation $Initial PT Evaluation Tier I: 1 Procedure PT  Treatments $Therapeutic Exercise: 8-22 mins $Therapeutic Activity: 23-37 mins   PT G Codes:          Ok Edwards Aug 21, 2013, 2:30 PM

## 2013-07-27 NOTE — Addendum Note (Signed)
Addendum created 07/27/13 1331 by Earleen Newport, CRNA   Modules edited: Notes Section   Notes Section:  File: [id number]

## 2013-07-27 NOTE — Anesthesia Postprocedure Evaluation (Signed)
  Anesthesia Post-op Note  Patient: Joe Mccullough.  Procedure(s) Performed: Procedure(s): TOTAL HIP ARTHROPLASTY (Right)  Patient Location: Room 318  Anesthesia Type:Spinal  Level of Consciousness: awake, alert , oriented and patient cooperative  Airway and Oxygen Therapy: Patient Spontanous Breathing  Post-op Pain: mild  Post-op Assessment: Post-op Vital signs reviewed, Patient's Cardiovascular Status Stable, Respiratory Function Stable, Patent Airway, No signs of Nausea or vomiting and Pain level controlled  Post-op Vital Signs: Reviewed and stable  Last Vitals:  Filed Vitals:   07/27/13 0427  BP: 125/69  Pulse: 85  Temp: 36.9 C  Resp: 20    Complications: No apparent anesthesia complications

## 2013-07-28 LAB — CBC
HEMATOCRIT: 29.5 % — AB (ref 39.0–52.0)
HEMOGLOBIN: 9.2 g/dL — AB (ref 13.0–17.0)
MCH: 24.3 pg — ABNORMAL LOW (ref 26.0–34.0)
MCHC: 31.2 g/dL (ref 30.0–36.0)
MCV: 77.8 fL — AB (ref 78.0–100.0)
Platelets: 393 10*3/uL (ref 150–400)
RBC: 3.79 MIL/uL — ABNORMAL LOW (ref 4.22–5.81)
RDW: 18.4 % — ABNORMAL HIGH (ref 11.5–15.5)
WBC: 7.2 10*3/uL (ref 4.0–10.5)

## 2013-07-28 NOTE — Progress Notes (Signed)
Subjective: 2 Days Post-Op Procedure(s) (LRB): TOTAL HIP ARTHROPLASTY (Right) Patient reports pain as mild.    Objective: Vital signs in last 24 hours: Temp:  [98.1 F (36.7 C)-98.5 F (36.9 C)] 98.5 F (36.9 C) (05/03 0410) Pulse Rate:  [80-82] 82 (05/03 0410) Resp:  [20] 20 (05/03 0410) BP: (109-118)/(50-57) 118/56 mmHg (05/03 0410) SpO2:  [93 %-98 %] 98 % (05/03 0410)  Intake/Output from previous day: 05/02 0701 - 05/03 0700 In: 1440 [P.O.:1440] Out: 4150 [Urine:4150] Intake/Output this shift:     Recent Labs  07/26/13 0517 07/27/13 0617 07/28/13 0611  HGB 10.4* 8.8* 9.2*    Recent Labs  07/27/13 0617 07/28/13 0611  WBC 7.5 7.2  RBC 3.68* 3.79*  HCT 28.9* 29.5*  PLT 393 393    Recent Labs  07/26/13 0517 07/27/13 0617  NA 137 135*  K 4.7 4.7  CL 99 99  CO2 28 30  BUN 10 8  CREATININE 0.51 0.51  GLUCOSE 100* 104*  CALCIUM 8.2* 8.0*   No results found for this basename: LABPT, INR,  in the last 72 hours  Neurologically intact Neurovascular intact Sensation intact distally Intact pulses distally Dorsiflexion/Plantar flexion intact  Assessment/Plan: 2 Days Post-Op Procedure(s) (LRB): TOTAL HIP ARTHROPLASTY (Right) Advance diet Up with therapy D/C IV fluids  Joe Mccullough 07/28/2013, 8:12 AM

## 2013-07-28 NOTE — Progress Notes (Signed)
  PROGRESS NOTE  Joe Mccullough. ZDG:[id number] DOB: 01-24-1956 DOA: 07/23/2013 PCP: Colette Ribas, MD  Summary: 58 year old man with history of recurrent redness presented with history of right hip and knee pain status post fall. Imaging revealed fractures of the right medial femoral condyle and a right femoral neck.   Assessment/Plan: 1. Fractures right femoral condyle and right femoral neck secondary to mechanical fall. Status postoperative repair 5/1. Management per orthopedics. 2. Microcytic anemia, iron deficient. Stable. Suggest outpatient follow-up. 3. COPD. remained stable. 4. H/o rheumatoid arthritis. Methotrexate on hold. 5. History of pulmonary nodule. 6. Tobacco dependence.   Continue postoperative management per orthopedics.  DVT prophylaxis deferred to orthopedics.  May need skilled nursing facility placement.  Code Status: full code DVT prophylaxis: SCDs Family Communication: none present Disposition Plan: pending  Brendia Sacks, MD  [facility]  Pager [phone] If 7PM-7AM, please contact night-coverage at [email or web address], password [facility] 07/28/2013, 5:52 PM  LOS: 5 days   Consultants:  Orthopedics  Procedures:  5/1 TOTAL HIP ARTHROPLASTY (Right)  Antibiotics:    HPI/Subjective: Feeling well. Eating well. Pain controlled.  Objective: Filed Vitals:   07/27/13 1435 07/27/13 2014 07/28/13 0410 07/28/13 1437  BP: 109/57 110/50 118/56 110/62  Pulse: 82 80 82 80  Temp: 98.1 F (36.7 C) 98.1 F (36.7 C) 98.5 F (36.9 C) 98.4 F (36.9 C)  TempSrc: Oral Oral Oral Oral  Resp: 20 20 20 20   Height:      Weight:      SpO2: 93% 95% 98% 98%    Intake/Output Summary (Last 24 hours) at 07/28/13 1752 Last data filed at 07/28/13 0626  Gross per 24 hour  Intake    360 ml  Output   2450 ml  Net  -2090 ml     Filed Weights   07/23/13 1606 07/23/13 2245  Weight: 63.504 kg (140 lb) 56.972 kg (125 lb 9.6 oz)    Exam:   Afebrile, vital  signs are stable. No hypoxia.  Gen. Appears calm and comfortable.  Cardiovascular. Regular rate and rhythm. No murmur, rub gallop.  Respiratory. Clear to auscultation bilaterally. No wheezes, rales or rhonchi. Normal respiratory effort.  Psychiatric. Grossly normal affect. Speech fluent and appropriate.  Data Reviewed:  Hemoglobin 9.2  Scheduled Meds: . aspirin EC  325 mg Oral Q breakfast  . docusate sodium  100 mg Oral BID  . ferrous sulfate  325 mg Oral Q breakfast  . multivitamin with minerals  1 tablet Oral Daily  . nicotine  14 mg Transdermal Daily  . pantoprazole  40 mg Oral Daily  . senna  1 tablet Oral BID   Continuous Infusions:   Principal Problem:   Hip fracture Active Problems:   Tobacco abuse   COPD (chronic obstructive pulmonary disease) with chronic bronchitis   Microcytic anemia   Pulmonary nodule, left   Femoral neck fracture   Rheumatoid arthritis   Knee fracture, right   Time spent 15 minutes

## 2013-07-29 LAB — CBC
HCT: 29.7 % — ABNORMAL LOW (ref 39.0–52.0)
Hemoglobin: 9.3 g/dL — ABNORMAL LOW (ref 13.0–17.0)
MCH: 24.2 pg — ABNORMAL LOW (ref 26.0–34.0)
MCHC: 31.3 g/dL (ref 30.0–36.0)
MCV: 77.1 fL — ABNORMAL LOW (ref 78.0–100.0)
PLATELETS: 480 10*3/uL — AB (ref 150–400)
RBC: 3.85 MIL/uL — AB (ref 4.22–5.81)
RDW: 18.2 % — ABNORMAL HIGH (ref 11.5–15.5)
WBC: 7 10*3/uL (ref 4.0–10.5)

## 2013-07-29 MED ORDER — POLYETHYLENE GLYCOL 3350 17 G PO PACK: 17.0000 g | PACK | Freq: Every day | ORAL | Status: AC | PRN

## 2013-07-29 MED ORDER — METHOTREXATE 2.5 MG PO TABS
7.5000 mg | ORAL_TABLET | ORAL | Status: DC
  Administered 2013-07-29: 7.5 mg via ORAL
  Filled 2013-07-29: qty 3

## 2013-07-29 MED ORDER — ASPIRIN 325 MG PO TBEC: 325.0000 mg | DELAYED_RELEASE_TABLET | Freq: Every day | ORAL | Status: AC

## 2013-07-29 MED ORDER — DIPHENHYDRAMINE HCL 12.5 MG/5ML PO ELIX: 12.5000 mg | ORAL_SOLUTION | ORAL | Status: AC | PRN

## 2013-07-29 MED ORDER — ALUM & MAG HYDROXIDE-SIMETH 200-200-20 MG/5ML PO SUSP: 30.0000 mL | ORAL | Status: AC | PRN

## 2013-07-29 MED ORDER — ONDANSETRON HCL 4 MG PO TABS: 4.0000 mg | ORAL_TABLET | Freq: Four times a day (QID) | ORAL | Status: AC | PRN

## 2013-07-29 MED ORDER — BISACODYL 10 MG RE SUPP: 10.0000 mg | Freq: Every day | RECTAL | Status: AC | PRN

## 2013-07-29 MED ORDER — METHOTREXATE SODIUM 7.5 MG PO TABS: 7.5000 mg | ORAL_TABLET | ORAL | Status: AC

## 2013-07-29 MED ORDER — ADULT MULTIVITAMIN W/MINERALS CH: 1.0000 | ORAL_TABLET | Freq: Every day | ORAL | Status: AC

## 2013-07-29 MED ORDER — PANTOPRAZOLE SODIUM 40 MG PO TBEC: 40.0000 mg | DELAYED_RELEASE_TABLET | Freq: Every day | ORAL | Status: AC

## 2013-07-29 MED ORDER — NICOTINE 14 MG/24HR TD PT24: 14.0000 mg | MEDICATED_PATCH | Freq: Every day | TRANSDERMAL | Status: AC

## 2013-07-29 MED ORDER — METHOCARBAMOL 500 MG PO TABS: 500.0000 mg | ORAL_TABLET | Freq: Four times a day (QID) | ORAL | Status: AC | PRN

## 2013-07-29 MED ORDER — FERROUS SULFATE 325 (65 FE) MG PO TABS: 325.0000 mg | ORAL_TABLET | Freq: Every day | ORAL | Status: AC

## 2013-07-29 MED ORDER — DSS 100 MG PO CAPS: 100.0000 mg | ORAL_CAPSULE | Freq: Two times a day (BID) | ORAL | Status: AC

## 2013-07-29 NOTE — Progress Notes (Signed)
Patient ID: Joe Mccullough., male   DOB: 08/02/1955, 58 y.o.   MRN: [id number]  Continue physical therapy  Continue dvt prevention: aspirin x 6 weeks   Stockings x 6 weeks (from surgery)   Abd Pillow x 6 weeks (from surgery)   Weight bearing as tolerated / if he cant place foot on ground then allow non weight bearing   Brace right knee x 6 weeks   F/u with me pod 14 or so

## 2013-07-29 NOTE — Evaluation (Signed)
Occupational Therapy Evaluation Patient Details Name: Joe Mccullough. MRN: [id number] DOB: 03-18-1956 Today's Date: 07/29/2013    History of Present Illness 58 year old man with history of recurrent redness presented with history of right hip and knee pain status post fall. Imaging revealed fractures of the right medial femoral condyle and a right femoral neck. S/p R THA.   Clinical Impression   Pt is presenting to acute OT with above situation.  He is currenlty at a MaxA level with all LE ADL tasks.  Education on LE dressing strategies initiated during this session - pt will benefit from further education and practice for ADL strategies.  Recommend SNF OT at this time.      Follow Up Recommendations  SNF    Equipment Recommendations   (Defer to SNF for d/c home.)    Recommendations for Other Services       Precautions / Restrictions Precautions Precautions: Fall Precaution Comments: WBAT Restrictions Weight Bearing Restrictions: Yes (WBAT) RLE Weight Bearing: Weight bearing as tolerated      Mobility Bed Mobility  Transfers            Balance                                            ADL Overall ADL's : Needs assistance/impaired             Lower Body Bathing: Maximal assistance       Lower Body Dressing: Maximal assistance   Toilet Transfer: Maximal assistance             General ADL Comments: pt is currentl at a max assist level for all LE ADLs. Pt was ahving difficult with LE dressign and bathing prior to admission, but was able to complete tasks with increased time.     Vision                     Perception     Praxis      Pertinent Vitals/Pain Pt in 5/10 pain, with icepack, on righ knee     Hand Dominance Left   Extremity/Trunk Assessment Upper Extremity Assessment Upper Extremity Assessment: Overall WFL for tasks assessed   Lower Extremity Assessment Lower Extremity Assessment: Defer to PT  evaluation       Communication Communication Communication: No difficulties   Cognition Arousal/Alertness: Awake/alert Behavior During Therapy: WFL for tasks assessed/performed Overall Cognitive Status: Within Functional Limits for tasks assessed                     General Comments       Exercises       Shoulder Instructions      Home Living Family/patient expects to be discharged to:: Skilled nursing facility Living Arrangements: Alone                               Additional Comments: Sister live 1.5 hours away, but says she could come 2x per week if needed.      Prior Functioning/Environment Level of Independence: Needs assistance  Gait / Transfers Assistance Needed: Ambulated with RW at home.  Was not using at the time of his fall. ADL's / Homemaking Assistance Needed: Pt was at a modified independent level with his ADLs. Per sister, pt was not completing them  well.  Pt was receiving assist for IADLs, including ording food to the house and his sister taking out the trash.        OT Diagnosis: Acute pain (Decreased ADL status)   OT Problem List: Decreased knowledge of use of DME or AE;Decreased coordination;Decreased range of motion;Decreased activity tolerance;Decreased knowledge of precautions;Pain   OT Treatment/Interventions: Self-care/ADL training;Therapeutic exercise;DME and/or AE instruction;Therapeutic activities;Patient/family education    OT Goals(Current goals can be found in the care plan section) Acute Rehab OT Goals Patient Stated Goal: to be able to walk OT Goal Formulation: With patient Time For Goal Achievement: 08/12/13 Potential to Achieve Goals: Fair ADL Goals Pt Will Perform Lower Body Bathing: with min assist Pt Will Perform Lower Body Dressing: with min assist Pt Will Transfer to Toilet: with mod assist  OT Frequency: Min 2X/week   Barriers to D/C:            Co-evaluation              End of Session     Activity Tolerance: Patient tolerated treatment well Patient left: in chair;with call bell/phone within reach;with chair alarm set;with family/visitor present   Time: 4827-0786 OT Time Calculation (min): 28 min Charges:  OT General Charges $OT Visit: 1 Procedure OT Evaluation $Initial OT Evaluation Tier I: 1 Procedure OT Treatments $Self Care/Home Management : 8-22 mins G-Codes:      Marry Guan, MS, OTR/L [phone]  07/29/2013, 11:30 AM

## 2013-07-29 NOTE — Progress Notes (Signed)
Subjective: 3 Days Post-Op Procedure(s) (LRB): TOTAL HIP ARTHROPLASTY (Right) He is comfortable complaining of more knee pain and hip pain. 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 We will change his weight bearing status to weightbearing as tolerated allowed nonweightbearing if needed hemoglobin is 9.3 his admission hemoglobin was about 9 so no further transfusions needed he is asymptomatic.  Objective: Vital signs in last 24 hours: Temp:  [98 F (36.7 C)-98.4 F (36.9 C)] 98 F (36.7 C) (05/04 0423) Pulse Rate:  [80-88] 81 (05/04 0423) Resp:  [20] 20 (05/04 0423) BP: (110-139)/(62-94) 116/73 mmHg (05/04 0423) SpO2:  [96 %-100 %] 96 % (05/04 0423)  Intake/Output from previous day: 05/03 0701 - 05/04 0700 In: 1200 [P.O.:1200] Out: 1650 [Urine:1650] Intake/Output this shift:     Recent Labs  07/27/13 0617 07/28/13 0611 07/29/13 0540  HGB 8.8* 9.2* 9.3*    Recent Labs  07/28/13 0611 07/29/13 0540  WBC 7.2 7.0  RBC 3.79* 3.85*  HCT 29.5* 29.7*  PLT 393 480*    Recent Labs  07/27/13 0617  NA 135*  K 4.7  CL 99  CO2 30  BUN 8  CREATININE 0.51  GLUCOSE 104*  CALCIUM 8.0*   No results found for this basename: LABPT, INR,  in the last 72 hours  Dressing clean dry and intact  Assessment/Plan: 3 Days Post-Op Procedure(s) (LRB): TOTAL HIP ARTHROPLASTY (Right) Up with therapy Discharge to SNF when bed available  Vickki Hearing 07/29/2013, 7:09 AM

## 2013-07-29 NOTE — Clinical Social Work Psychosocial (Signed)
Clinical Social Work Department BRIEF PSYCHOSOCIAL ASSESSMENT 07/29/2013  Patient:  Joe Mccullough, Joe Mccullough     Account Number:  [id number]     Admit date:  07/17/2013  Clinical Social Worker:  Daiva Huge  Date/Time:  07/29/2013 10:32 AM  Referred by:  Physician  Date Referred:  07/29/2013 Referred for  SNF Placement   Other Referral:   Interview type:  Patient Other interview type:    PSYCHOSOCIAL DATA Living Status:  ALONE Admitted from facility:   Level of care:   Primary support name:  MOTHER Primary support relationship to patient:  FAMILY Degree of support available:   GOOD    CURRENT CONCERNS Current Concerns  Post-Acute Placement   Other Concerns:    SOCIAL WORK ASSESSMENT / PLAN Met with patient who reports that he lives alone- he was at work when he fell on the grass- 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   Assessment/plan status:  Other - See comment Other assessment/ plan:   FL2 and PASARR for SNF search   Information/referral to community resources:   SNF list  Workers comp    PATIENT'S/FAMILY'S RESPONSE TO PLAN OF CARE: Patient is agreeable to SNF search as he lives alone and will need rehab for his hip fx. I have discussed SNF search, coverage including workers comp with him and he understands-  CSW will call patient's employer to pursue Workers Comp SNF [facility].    Eduard Clos, MSW, [place]

## 2013-07-29 NOTE — Discharge Summary (Signed)
Patient seen, independently examined and chart reviewed. I agree with exam, assessment and plan discussed with Toya Smothers, NP.  Subjective: No issues overnight. He is feeling well overall. Eating well. Pain control.  Objective: Afebrile, vital signs are stable. No hypoxia. He appears calm and comfortable. Speech is fluent and clear. Cardiovascular regular rate and rhythm. No murmur, rub or gallop. Respiratory clear to auscultation bilaterally. No wheezes, rales or rhonchi. Normal respiratory effort.  Hemoglobin stable at 9.3.  He seems to be doing well postoperatively. He is stable for discharge today unfortunately his insurance has not yet authorized. Note Dr. Romeo Apple recommended aspirin for DVT prophylaxis.  Brendia Sacks, MD [facility] [phone]

## 2013-07-29 NOTE — Clinical Social Work Placement (Addendum)
Clinical Social Work Department CLINICAL SOCIAL WORK PLACEMENT NOTE 07/29/2013  Patient:  Joe Mccullough, Joe Mccullough  Account Number:  [id number] Admit date:  07/17/2013  Clinical Social Worker:  Robin Searing  Date/time:  07/29/2013 10:40 AM  Clinical Social Work is seeking post-discharge placement for this patient at the following level of care:   SKILLED NURSING   (*CSW will update this form in Epic as items are completed)   07/29/2013  Patient/family provided with [facility] Department of Clinical Social Work's list of facilities offering this level of care within the geographic area requested by the patient (or if unable, by the patient's family).  07/29/2013  Patient/family informed of their freedom to choose among providers that offer the needed level of care, that participate in Medicare, Medicaid or managed care program needed by the patient, have an available bed and are willing to accept the patient.  07/29/2013  Patient/family informed of [facility]' ownership interest in [facility], as well as of the fact that they are under no obligation to receive care at this facility.  PASARR submitted to [employer] on 07/29/2013 PASARR number received from [employer] on 07/29/2013  FL2 transmitted to all facilities in geographic area requested by pt/family on  07/29/2013 FL2 transmitted to all facilities within larger geographic area on   Patient informed that his/her managed care company has contracts with or will negotiate with  certain facilities, including the following:     Patient/family informed of bed offers received:  08/01/13 Patient chooses bed at  [facility] Physician recommends and patient chooses bed at    Patient to be transferred to [facility] Patient to be transferred to facility by EMS  The following physician request were entered in Epic:   Additional Comments: Reece Levy, MSW, [facility]resia Majors [phone]

## 2013-07-29 NOTE — Progress Notes (Signed)
Physical Therapy Treatment Patient Details Name: Joe Mccullough. MRN: [id number] DOB: 11-Feb-1956 Today's Date: 07/29/2013    History of Present Illness      PT Comments    Pt eager to participate with therapy today.  Limited by pain in Rt knee and hip scale 5/10 increase to 7/10 at end of session.  Contracture Rt knee 110 degrees limited standing tolerance and required increased assistance (max) with transfer from bed to chair.  Pt able to stand for 10 seconds with max assistance and cueing for handplacement to assist with transfer.  Pt left in chair with call bell within reach and chair alarm set.  Pt encouraged to notify RN for pain control assistance.  Ice was applied to knee and hip for pain control.  Follow Up Recommendations        Equipment Recommendations       Recommendations for Other Services       Precautions / Restrictions Precautions Precaution Comments: WBAT Restrictions Weight Bearing Restrictions: No    Mobility  Bed Mobility Overal bed mobility: Needs Assistance Bed Mobility: Supine to Sit     Supine to sit: Max assist     General bed mobility comments: increased time and HOB elevated  Transfers Overall transfer level: Needs assistance Equipment used: Rolling walker (2 wheeled) Transfers: Sit to/from [employer] Transfers Sit to Stand: Max assist Stand pivot transfers: Max assist          Ambulation/Gait             General Gait Details: unable at this time   Stairs            Wheelchair Mobility    Modified Rankin (Stroke Patients Only)       Balance                                    Cognition Arousal/Alertness: Awake/alert Behavior During Therapy: WFL for tasks assessed/performed Overall Cognitive Status: Within Functional Limits for tasks assessed                      Exercises Total Joint Exercises Ankle Circles/Pumps: AROM;Both;10 reps;Supine Quad Sets: AROM;Left;10  reps;Supine Knee Flexion: AAROM;10 reps;Supine Goniometric ROM: R knee flexion contractture to 110 degrees and left knee with extension lag of 8 degrees    General Comments        Pertinent Vitals/Pain Pain scale 5/10 Rt knee and hip initially supine in bed, increased pain scale to 7/10 following transfer to chair.    Home Living                      Prior Function            PT Goals (current goals can now be found in the care plan section) Progress towards PT goals: Progressing toward goals    Frequency       PT Plan Current plan remains appropriate    Co-evaluation             End of Session Equipment Utilized During Treatment: Gait belt Activity Tolerance: Patient limited by pain;Patient limited by fatigue;Patient tolerated treatment well Patient left: in chair;with call bell/phone within reach;with chair alarm set;with nursing/sitter in room     Time: 6433-2951 PT Time Calculation (min): 52 min  Charges:  $Therapeutic Exercise: 8-22 mins $Therapeutic Activity: 23-37 mins  G Codes:      Juel Burrow 07/29/2013, 9:37 AM

## 2013-07-29 NOTE — Discharge Summary (Addendum)
Physician Discharge Summary  Joe Mccullough. EZM:[id number] DOB: 01-31-1956 DOA: 07/23/2013  PCP: Colette Ribas, MD  Admit date: 07/23/2013 Discharge date: 08/01/2013  Time spent: 40 minutes  Recommendations for Outpatient Follow-up:  1. Dr Romeo Apple 2 weeks for first post op visit. Will eventually need right knee replacement. Dr Romeo Apple to follow 2. PCP in 1-2 weeks for tracking of Hg.  3. Continue abduction pillow x 6 weeks. Stockings x6weeks. Weight bearing as tolerated/if he can't place foot on ground then allow non-weight bearing.   Discharge Diagnoses:  Principal Problem:   Hip fracture Active Problems:   Tobacco abuse   COPD (chronic obstructive pulmonary disease) with chronic bronchitis   Microcytic anemia   Pulmonary nodule, left   Femoral neck fracture   Rheumatoid arthritis   Knee fracture, right   Discharge Condition: stable  Diet recommendation: regular  Filed Weights   07/23/13 1606 07/23/13 2245 07/31/13 0445  Weight: 63.504 kg (140 lb) 56.972 kg (125 lb 9.6 oz) 57.788 kg (127 lb 6.4 oz)    History of present illness:  Joe Mccullough. is a 58 y.o. male with fairly crippling rheumatoid arthritis who presented to ED on 07/23/13 having had a fall and sustaining pain in the right knee and also right hip. He reported that his leg gave way and he fell in the grass. Further evaluation in the emergency room showed him to a fracture in the right knee area and also the right hip neck of femur. He was admitted for management of the fractures.  Hospital Course:  1. Fractures right femoral condyle and right femoral neck secondary to mechanical fall. Status postoperative repair 5/1. Will be discharged to facility with abduction pillow x6 weeks, weight bearing as tolerated if he can get his foot on the ground, if not allow non-weight bearing, asa 6 weeks for DVT prophylaxis, brace to right knee x 6 weeks. Follow up with Dr Romeo Apple 2 weeks 2. Microcytic anemia, iron  deficient. Decreased postoperatively. Stable at 9.3. Received 1 unit preoperatively. recommend outpatient to follow. 3. COPD. Stable. 4. H/o rheumatoid arthritis. Methotrexate held initially. Resumed 07/29/13. Appears stable at baseline 5. History of pulmonary nodule. Recommend OP CT as follow up. 6. Tobacco dependence. Cessation counseling. Nicotine patch.   Procedures: 5/1 TOTAL HIP ARTHROPLASTY (Right)  Consultations:  orthopedics  Discharge Exam: Filed Vitals:   08/01/13 0414  BP: 109/89  Pulse: 81  Temp: 98 F (36.7 C)  Resp: 20    General: appears comfortable NAD Cardiovascular: RRR No MGR No LE edema Respiratory: normal effort BS clear bilaterally no wheeze MS: chronic knee and hip flexion contracture.  Discharge Instructions You were cared for by a hospitalist during your hospital stay. If you have any questions about your discharge medications or the care you received while you were in the hospital after you are discharged, you can call the unit and asked to speak with the hospitalist on call if the hospitalist that took care of you is not available. Once you are discharged, your primary care physician will handle any further medical issues. Please note that NO REFILLS for any discharge medications will be authorized once you are discharged, as it is imperative that you return to your primary care physician (or establish a relationship with a primary care physician if you do not have one) for your aftercare needs so that they can reassess your need for medications and monitor your lab values.     Medication List  albuterol 108 (90 BASE) MCG/ACT inhaler  Commonly known as:  PROVENTIL HFA;VENTOLIN HFA  Inhale 2 puffs into the lungs every 6 (six) hours as needed for wheezing or shortness of breath.     alum & mag hydroxide-simeth 200-200-20 MG/5ML suspension  Commonly known as:  MAALOX/MYLANTA  Take 30 mLs by mouth every 4 (four) hours as needed for indigestion.      aspirin 325 MG EC tablet  Take 1 tablet (325 mg total) by mouth daily with breakfast.     bisacodyl 10 MG suppository  Commonly known as:  DULCOLAX  Place 1 suppository (10 mg total) rectally daily as needed for moderate constipation.     diphenhydrAMINE 12.5 MG/5ML elixir  Commonly known as:  BENADRYL  Take 5-10 mLs (12.5-25 mg total) by mouth every 4 (four) hours as needed for itching.     DSS 100 MG Caps  Take 100 mg by mouth 2 (two) times daily.     ferrous sulfate 325 (65 FE) MG tablet  Take 1 tablet (325 mg total) by mouth daily with breakfast.     furosemide 20 MG tablet  Commonly known as:  LASIX  Take 1 tablet (20 mg total) by mouth daily. Take daily for swelling in your legs.     HYDROcodone-acetaminophen 10-325 MG per tablet  Commonly known as:  NORCO  Take 1 tablet by mouth every 6 (six) hours as needed for moderate pain.     methocarbamol 500 MG tablet  Commonly known as:  ROBAXIN  Take 1 tablet (500 mg total) by mouth every 6 (six) hours as needed for muscle spasms.     methotrexate 2.5 MG tablet  Take 7.5 mg by mouth once a week. Takes on Mon     methotrexate 7.5 MG tablet  Commonly known as:  RHEUMATREX  Take 1 tablet (7.5 mg total) by mouth once a week. Caution:Chemotherapy. Protect from light.     multivitamin with minerals Tabs tablet  Take 1 tablet by mouth daily.     nicotine 14 mg/24hr patch  Commonly known as:  NICODERM CQ - dosed in mg/24 hours  Place 1 patch (14 mg total) onto the skin daily.     ondansetron 4 MG tablet  Commonly known as:  ZOFRAN  Take 1 tablet (4 mg total) by mouth every 6 (six) hours as needed for nausea.     pantoprazole 40 MG tablet  Commonly known as:  PROTONIX  Take 1 tablet (40 mg total) by mouth daily.     polyethylene glycol packet  Commonly known as:  MIRALAX / GLYCOLAX  Take 17 g by mouth daily as needed for mild constipation.       Allergies  Allergen Reactions  . Morphine And Related     Delirium    Follow-up Information   Follow up with Fuller Canada, MD In 3 weeks. (for first post op visit.)    Specialties:  Orthopedic Surgery, Radiology   Contact information:   [address] [place] [address] [phone]       Follow up with Colette Ribas, MD. Schedule an appointment as soon as possible for a visit in 2 weeks. (recommend cbc to track Hg. )    Specialty:  Family Medicine   Contact information:   [address] [place] [address] [phone]        The results of significant diagnostics from this hospitalization (including  imaging, microbiology, ancillary and laboratory) are listed below for reference.    Significant Diagnostic Studies: Dg Chest 1 View  07/23/2013   CLINICAL DATA:  Fall, severe right hip in knee pain, rheumatoid arthritis and COPD  EXAM: CHEST - 1 VIEW  COMPARISON:  Prior chest CT 01/29/2012; prior chest x-ray 01/29/2012  FINDINGS: Cardiac and mediastinal contours within normal limits for size. No focal airspace consolidation, pneumothorax or pleural effusion. Diffuse central bronchitic changes and mild interstitial prominence. Unremarkable visualized upper abdominal bowel gas pattern.  IMPRESSION: Stable chest x-ray without evidence of acute cardiopulmonary process.   Electronically Signed   By: Malachy Moan M.D.   On: 07/23/2013 19:20   Dg Pelvis 1-2 Views  07/23/2013   CLINICAL DATA:  Fall.  Right pelvic and hip pain.  EXAM: PELVIS - 1-2 VIEW  COMPARISON:  CT on 01/30/2012  FINDINGS: Acute fracture of the right femoral neck is seen. No evidence of hip dislocation.  Fracture deformity of the left pubic bone is seen which appears chronic. Multiple surgical clips and staples are seen in the left pelvis. No acute pelvic fracture identified.  IMPRESSION: Acute right femoral neck fracture.  Old fracture deformity of the left pubis. No acute pelvic fracture identified.    Electronically Signed   By: Myles Rosenthal M.D.   On: 07/23/2013 19:06   Dg Femur Right  07/23/2013   CLINICAL DATA:  Fall.  Right hip and knee pain.  EXAM: RIGHT FEMUR - 2 VIEW  COMPARISON:  None.  FINDINGS: Mildly displaced right femoral neck fracture is seen. No distal femur fracture identified.  IMPRESSION: Mildly displaced right femoral neck fracture.   Electronically Signed   By: Myles Rosenthal M.D.   On: 07/23/2013 19:07   Dg Pelvis Portable  07/26/2013   CLINICAL DATA:  post o p  EXAM: PORTABLE PELVIS 1-2 VIEWS  COMPARISON:  DG PELVIS 1-2 VIEWS dated 07/23/2013  FINDINGS: Patient status post total right hip arthroplasty. Hardware appears intact without evidence of loosening or failure. Native osseous structures demonstrate no gross acute abnormalities. Chronic deformity of the left pubis. Postsurgical changes within the soft tissues surrounding the right hip.  IMPRESSION: Patient is status post total right hip arthroplasty.   Electronically Signed   By: Salome Holmes M.D.   On: 07/26/2013 16:18   Dg Knee Complete 4 Views Right  07/23/2013   CLINICAL DATA:  Right knee pain after fall.  EXAM: RIGHT KNEE - COMPLETE 4+ VIEW  COMPARISON:  June 21, 2011.  FINDINGS: Mild suprapatellar joint effusion is noted. There appears to be a comminuted fracture involving the articular surface of the medial femoral condyle. No dislocation is noted on the lateral radiograph.  IMPRESSION: Comminuted fracture seen involving the medial femoral condyle. Mild joint effusion is noted.   Electronically Signed   By: Roque Lias M.D.   On: 07/23/2013 16:43   Dg Knee Right Port  07/26/2013   CLINICAL DATA:  Right hip fracture.  Patient for fracture fixation.  EXAM: PORTABLE RIGHT KNEE - 1-2 VIEW  COMPARISON:  Plain films of the right knee 07/23/2013.  FINDINGS: Single portable view is provided. No acute bony or joint abnormality is identified. Degenerative change in the medial compartment is noted with there is near bone-on-bone  joint space narrowing. The patient appears to have a remote healed proximal fibular fracture.  IMPRESSION: No acute finding.  Advanced medial compartment osteoarthritis.   Electronically Signed   By: Drusilla Kanner M.D.   On:  07/26/2013 14:10    Microbiology: Recent Results (from the past 240 hour(s))  SURGICAL PCR SCREEN     Status: None   Collection Time    07/25/13 11:05 AM      Result Value Ref Range Status   MRSA, PCR NEGATIVE  NEGATIVE Final   Staphylococcus aureus NEGATIVE  NEGATIVE Final   Comment:            The Xpert SA Assay (FDA     approved for NASAL specimens     in patients over 39 years of age),     is one component of     a comprehensive surveillance     program.  Test performance has     been validated by [employer] for patients greater     than or equal to 64 year old.     It is not intended     to diagnose infection nor to     guide or monitor treatment.     Labs: Basic Metabolic Panel:  Recent Labs Lab 07/26/13 0517 07/27/13 0617  NA 137 135*  K 4.7 4.7  CL 99 99  CO2 28 30  GLUCOSE 100* 104*  BUN 10 8  CREATININE 0.51 0.51  CALCIUM 8.2* 8.0*   Liver Function Tests: No results found for this basename: AST, ALT, ALKPHOS, BILITOT, PROT, ALBUMIN,  in the last 168 hours No results found for this basename: LIPASE, AMYLASE,  in the last 168 hours No results found for this basename: AMMONIA,  in the last 168 hours CBC:  Recent Labs Lab 07/26/13 0517 07/27/13 0617 07/28/13 0611 07/29/13 0540 08/01/13 0455  WBC 8.0 7.5 7.2 7.0 5.2  HGB 10.4* 8.8* 9.2* 9.3* 9.1*  HCT 33.1* 28.9* 29.5* 29.7* 28.8*  MCV 76.4* 78.5 77.8* 77.1* 77.0*  PLT 428* 393 393 480* 597*   Cardiac Enzymes: No results found for this basename: CKTOTAL, CKMB, CKMBINDEX, TROPONINI,  in the last 168 hours BNP: BNP (last 3 results) No results found for this basename: PROBNP,  in the last 8760 hours CBG: No results found for this basename: GLUCAP,  in the last 168  hours     Signed:  Lesle Chris Black  [facility] 08/01/2013, 10:54 AM

## 2013-07-30 ENCOUNTER — Encounter (HOSPITAL_COMMUNITY): Payer: Self-pay | Admitting: Orthopedic Surgery

## 2013-07-30 NOTE — Progress Notes (Signed)
INITIAL NUTRITION ASSESSMENT  DOCUMENTATION CODES Per approved criteria  -Not Applicable   INTERVENTION: Send extra vegetable serving and source of protein with each meal  Recommend initiate bowel regimen. No BM since 4/28.  NUTRITION DIAGNOSIS: Increased nutrient needs related to hip fx s/p repair and inflammatory disease as evidenced by hx of RA and nutrition guidelines.   Goal: Pt to meet >/= 90% of their estimated nutrition needs    Monitor:  Meal intake, labs and weight changes  Reason for Assessment: Length of Stay  58 y.o. male  Admitting Dx: Hip fracture  ASSESSMENT: Hx of RA, COPD,Bilateral LE edema, microcytic anemia.  RD drawn to pt due to length of stay. He reports very good appetite which is also reflected in intake data 100% of most meals. He says he "could eat more if we would send it". He also receive MVI, Iron daily. He is also on methotrexate therefore recommend intake of folate rich foods such as beans, peas, lentils, dark leafy greens, broccoli, and citrus fruits. He has been medically cleared for discharge and waiting for insurance approval to SNF rehab. His weight is stable but his BMI is on the low-end of normal. He was last assessed by RD 01/30/12 when he was admitted related to bilateral LE edema.   Height: Ht Readings from Last 1 Encounters:  07/23/13 5\' 8"  (1.727 m)    Weight: Wt Readings from Last 1 Encounters:  07/23/13 125 lb 9.6 oz (56.972 kg)    Ideal Body Weight: 154# (70 kg)  % Ideal Body Weight: 81%  Wt Readings from Last 10 Encounters:  07/23/13 125 lb 9.6 oz (56.972 kg)  07/23/13 125 lb 9.6 oz (56.972 kg)  01/31/12 126 lb 12.2 oz (57.5 kg)    Usual Body Weight: 126#  % Usual Body Weight: 100%  BMI:  Body mass index is 19.1 kg/(m^2).normal range  Estimated Nutritional Needs: Kcal: 2000-2200 kcal  Protein: 80-95 gr Fluid: 2000 ml daily  Skin: surgical incision   Diet Order: General  EDUCATION NEEDS: -Education needs  addressed   Intake/Output Summary (Last 24 hours) at 07/30/13 1153 Last data filed at 07/30/13 0623  Gross per 24 hour  Intake    120 ml  Output   1875 ml  Net  -1755 ml    Last BM: 07/23/13  Labs:   Recent Labs Lab 07/25/13 0545 07/26/13 0517 07/27/13 0617  NA 134* 137 135*  K 3.6* 4.7 4.7  CL 98 99 99  CO2 28 28 30   BUN 9 10 8   CREATININE 0.50 0.51 0.51  CALCIUM 7.8* 8.2* 8.0*  GLUCOSE 126* 100* 104*    CBG (last 3)  No results found for this basename: GLUCAP,  in the last 72 hours  Scheduled Meds: . aspirin EC  325 mg Oral Q breakfast  . docusate sodium  100 mg Oral BID  . ferrous sulfate  325 mg Oral Q breakfast  . methotrexate  7.5 mg Oral Q Mon  . multivitamin with minerals  1 tablet Oral Daily  . nicotine  14 mg Transdermal Daily  . pantoprazole  40 mg Oral Daily  . senna  1 tablet Oral BID    Continuous Infusions:   Past Medical History  Diagnosis Date  . Rheumatoid arthritis(714.0) May 2013    Diagnosed with elevated rheumatoid factor/titer  . COPD (chronic obstructive pulmonary disease)   . Tobacco abuse   . Emphysema 01/30/2012  . Bilateral lower extremity edema 01/29/2012    Negative  for DVT; presumed to be secondary to NSAIDs  . Hilar adenopathy 01/31/2012  . Pulmonary nodule, left 01/31/2012    Past Surgical History  Procedure Laterality Date  . Inguinal hernia repair      As a child.  . Total hip arthroplasty Right 07/26/2013    Procedure: TOTAL HIP ARTHROPLASTY;  Surgeon: Vickki Hearing, MD;  Location: [facility];  Service: Orthopedics;  Laterality: Right;

## 2013-07-30 NOTE — Progress Notes (Signed)
Patient medically stable and ready for discharge to SNF as of 07/29/13. Unable to discharge due to delay in decision from insurance. SW working on Physicist, medical of guarantee.   Assessment/Plan  1. Fractures right femoral condyle and right femoral neck secondary to mechanical fall. Status postoperative repair 5/1. Will be discharged to facility with abduction pillow x6 weeks, weight bearing as tolerated if he can get his foot on the ground, if not allow non-weight bearing, asa 6 weeks for DVT prophylaxis, brace to right knee x 6 weeks. Follow up with Dr Romeo Apple 2 weeks 2. Microcytic anemia, iron deficient. Decreased postoperatively. Stable at 9.3. Received 1 unit preoperatively. recommend outpatient to follow. 3. COPD. Stable. 4. H/o rheumatoid arthritis. Methotrexate held initially. Resumed 07/29/13. Appears stable at baseline 5. History of pulmonary nodule. Recommend OP CT as follow up. 6. Tobacco dependence. Cessation counseling. Nicotine patch.  Physical Exam:  General: appears comfortable NAD  Cardiovascular: RRR No MGR No LE edema  Respiratory: normal effort BS clear bilaterally no wheeze  MS: chronic knee and hip flexion contracture    Lesle Chris. Black, NP  Attending note:  Awaiting placement. Stable.  Crista Curb, M.D. [facility]

## 2013-07-30 NOTE — Clinical Social Work Note (Signed)
Per RN at patient's work, his regular insurance does not cover SNF.  Is still trying to contact Workers Comp for their determination.  Santa Genera, LCSW Clinical Social Worker [phone])

## 2013-07-30 NOTE — Clinical Social Work Note (Signed)
Call from RN at [employer], still have not heard from Workers Comp.  Says unclear whether WC benefits will be approved, will also verify SNF benefits for rehab under regular insurance.  Santa Genera, LCSW Clinical Social Worker [phone])

## 2013-07-30 NOTE — Progress Notes (Signed)
Physical Therapy Treatment Patient Details Name: Joe Mccullough. MRN: [id number] DOB: 1956-02-06 Today's Date: Aug 12, 2013    History of Present Illness 58 year old man with history of recurrent redness presented with history of right hip and knee pain status post fall. Imaging revealed fractures of the right medial femoral condyle and a right femoral neck. S/p R THA.    PT Comments    Pt will not work through pain.  Attempted heelslide but pt would only complete minimal motion.   PROM less painful to pt.   Pt continues to need verbal cuing for safety ie pt felt he could transfer to the chair I when it took max assist.  Pt will need continued aggressive therapy if pt is to walk again.  Spoke to pt about having to work through the pain as the things we are asking him to do may be painful but they will not harm him.     Follow Up Recommendations  SNF     Equipment Recommendations  None recommended by PT       Precautions / Restrictions Precautions Precautions: Fall Precaution Comments: WBAT Restrictions Weight Bearing Restrictions: Yes RLE Weight Bearing: Weight bearing as tolerated    Mobility  Bed Mobility   Bed Mobility: Supine to Sit     Supine to sit: Mod assist     General bed mobility comments: increased time and HOB elevated  Transfers Overall transfer level: Needs assistance   Transfers: Stand Pivot Transfers Sit to Stand: Mod assist Stand pivot transfers: Max assist               Balance   Sitting-balance support: Bilateral upper extremity supported Sitting balance-Leahy Scale: Fair Sitting balance - Comments: sat edge of bed x 10 minutes.  As long as B UE supporrt pt does well but without UE support pt begins to fall                Cognition Arousal/Alertness: Awake/alert Behavior During Therapy: WFL for tasks assessed/performed Overall Cognitive Status: Within Functional Limits for tasks assessed          Exercises General Exercises  - Lower Extremity Ankle Circles/Pumps: Both;5 reps Heel Slides: Both;5 reps        Pertinent Vitals/Pain 7/10           PT Goals (current goals can now be found in the care plan section) Progress towards PT goals: Progressing toward goals    Frequency  7X/week    PT Plan Current plan remains appropriate       End of Session Equipment Utilized During Treatment: Gait belt Activity Tolerance: Patient limited by pain;Patient limited by fatigue;Patient tolerated treatment well Patient left: in chair;with call bell/phone within reach;with chair alarm set;with nursing/sitter in room     Time: 1500-1630 PT Time Calculation (min): 90 min  Charges:  $Therapeutic Exercise: 23-37 mins $Therapeutic Activity: 8-22 mins                    G Codes:      Bella Kennedy Aug 12, 2013, 4:30 PM

## 2013-07-31 NOTE — Progress Notes (Signed)
Patient medically stable and ready for discharge to SNF as of 07/29/13. Unable to discharge due to delay in decision from insurance. SW working on Physicist, medical of guarantee.  Assessment/Plan  1. Fractures right femoral condyle and right femoral neck secondary to mechanical fall. Status postoperative repair 5/1. Will be discharged to facility with abduction pillow x6 weeks, weight bearing as tolerated if he can get his foot on the ground, if not allow non-weight bearing, asa 6 weeks for DVT prophylaxis, brace to right knee x 6 weeks. Follow up with Dr Romeo Apple 2 weeks 2. Microcytic anemia, iron deficient. Decreased postoperatively. Stable at 9.3. Received 1 unit preoperatively. recommend outpatient to follow. 3. COPD. Stable. 4. H/o rheumatoid arthritis. Methotrexate held initially. Resumed 07/29/13. Appears stable at baseline 5. History of pulmonary nodule. Recommend OP CT as follow up. 6. Tobacco dependence. Cessation counseling. Nicotine patch.  Physical Exam:  General: appears comfortable NAD  Cardiovascular: RRR No MGR No LE edema  Respiratory: normal effort BS clear bilaterally no wheeze  MS: chronic knee and hip flexion contracture    Lesle Chris. Black, NP

## 2013-07-31 NOTE — Clinical Social Work Note (Signed)
CSW is continuing to work on SNF bed for patient.  [facility] is working diligently and hopes to advise later today. CSW will advise.   Reece Levy, MSW, [facility] [phone]

## 2013-07-31 NOTE — Progress Notes (Signed)
Physical Therapy Treatment Patient Details Name: Joe Mccullough. MRN: [id number] DOB: [birth year] Today's Date: 07/31/2013    History of Present Illness      PT Comments    Upon arrival to room patient was in lying in bed fetal position Rside up in a urine soaked bed and gown. Assisted pt with donning clean gown. Wanted to get patient clean and out of bed. Patient did not want assistance with getting to EOB or chair. Patient was MI to sit EOB and required Mod A for standing pivot transfer with RW to chair due to pain of R hip and patient somewhat panicking and wanting to sit to soon. Seated therex performed.  Follow Up Recommendations        Equipment Recommendations       Recommendations for Other Services       Precautions / Restrictions Restrictions Weight Bearing Restrictions: No RLE Weight Bearing: Weight bearing as tolerated    Mobility    Balance   Cognition                            Exercises General Exercises - Lower Extremity Hip Flexion/Marching: PROM;Seated;Both;10 reps;AROM (self) Toe Raises: Seated;AROM;Both;10 reps Heel Raises: AROM;Seated;Both;10 reps    General Comments             Home Living                      Prior Function            PT Goals (current goals can now be found in the care plan section) Progress towards PT goals: Progressing toward goals    Frequency       PT Plan      Co-evaluation             End of Session   Activity Tolerance: Patient limited by pain Patient left: in chair;with call bell/phone within reach;with chair alarm set     Time: 4888-9169 PT Time Calculation (min): 29 min  Charges:  $Therapeutic Exercise: 8-22 mins                    G Codes:      Elmer Sow 07/31/2013, 2:24 PM

## 2013-07-31 NOTE — Progress Notes (Signed)
The patient seen and examined. The above note reviewed.  He is status post operative repair for right femoral condyle and right femoral neck fracture. He needs continued physical therapy. He then cleared for discharge per orthopedics. Currently awaiting bed placement. Remainder of the medical issues appear to be stable.  [employer]

## 2013-08-01 DIAGNOSIS — R609 Edema, unspecified: Secondary | ICD-10-CM

## 2013-08-01 LAB — CBC
HCT: 28.8 % — ABNORMAL LOW (ref 39.0–52.0)
HEMOGLOBIN: 9.1 g/dL — AB (ref 13.0–17.0)
MCH: 24.3 pg — ABNORMAL LOW (ref 26.0–34.0)
MCHC: 31.6 g/dL (ref 30.0–36.0)
MCV: 77 fL — ABNORMAL LOW (ref 78.0–100.0)
Platelets: 597 10*3/uL — ABNORMAL HIGH (ref 150–400)
RBC: 3.74 MIL/uL — AB (ref 4.22–5.81)
RDW: 17.9 % — ABNORMAL HIGH (ref 11.5–15.5)
WBC: 5.2 10*3/uL (ref 4.0–10.5)

## 2013-08-01 MED ORDER — FUROSEMIDE 20 MG PO TABS: 20.0000 mg | ORAL_TABLET | Freq: Every day | ORAL | Status: AC

## 2013-08-01 MED ORDER — HYDROCODONE-ACETAMINOPHEN 10-325 MG PO TABS: 1.0000 | ORAL_TABLET | Freq: Four times a day (QID) | ORAL | Status: AC | PRN

## 2013-08-01 MED ORDER — FUROSEMIDE 40 MG PO TABS
40.0000 mg | ORAL_TABLET | Freq: Every day | ORAL | Status: AC
  Administered 2013-08-01: 40 mg via ORAL
  Filled 2013-08-01: qty 1

## 2013-08-01 NOTE — Progress Notes (Addendum)
Patient being transferred to [facility] via EMS. No IV access and MD aware. No c/o pain at this time. Report called to Nurse and made aware of patient coming. Dicussed with patient plan of care. Patient was d/c to facility without T-Scope Brace. Spoke with facility and MD and [employer] will go fit him for the brace. Talked with Social Workers and MD about the situation.

## 2013-08-01 NOTE — Clinical Social Work Note (Addendum)
Patient for d/c today to SNF bed at [facility]. Sister and patient agreeable to this plan- plan transfer via EMS. Reece Levy, MSW, [facility] [phone]

## 2013-08-01 NOTE — Progress Notes (Signed)
Order written for a Bledsoe brace on LE when ambulatory. Pt does not have one in his room. I discussed with nursing and relayed order to the nursing secretary who is following up. Pt is pending d/c to SNF possibly today. I will hold therapy until brace has been applied. MD please clarify if pt has posterior hip precautions.

## 2013-08-01 NOTE — Discharge Summary (Addendum)
Patient seen and examined.  Agree with note as above.  Patient is status post operative management of right-sided hip fracture. He also has right knee avascular necrosis and will eventually need a knee replacement. He's been accepted to a nursing facility for physical therapy. He will need to followup with Dr. Romeo Apple in the next few weeks. He will need outpatient CT chest to followup on pulmonary nodule. The remainder of his medical issues have remained stable.  [employer]

## 2013-08-01 NOTE — Progress Notes (Signed)
[facility] PROGRESS NOTE  Nat Christen. BJS:[id number] DOB: Mar 18, 1956 DOA: 07/23/2013 PCP: Colette Ribas, MD  Assessment/Plan: 1. Fracture right femoral condyle and right femoral neck secondary to mechanical fall. Status post operative repair on 5/1. Patient to be discharged to skilled nursing facility today. Follow up with orthopedics in 2-3 weeks. 2. Microcytic anemia, iron deficient, decrease postoperatively. Received one unit of PRBC preoperatively. This will need to be followed as an outpatient. 3. COPD. Stable 4. History arthritis. Currently on methotrexate. Joints do not appear inflamed. Currently stable. 5. History pulmonary nodule. Will need outpatient CT chest for followup.  Code Status: full code Family Communication: no family present Disposition Plan: discharge to SNF today    HPI/Subjective: Has pain in right knee (chronic), no other complaints.  Objective: Filed Vitals:   08/01/13 0414  BP: 109/89  Pulse: 81  Temp: 98 F (36.7 C)  Resp: 20    Intake/Output Summary (Last 24 hours) at 08/01/13 1402 Last data filed at 08/01/13 0640  Gross per 24 hour  Intake    480 ml  Output    800 ml  Net   -320 ml   Filed Weights   07/23/13 1606 07/23/13 2245 07/31/13 0445  Weight: 63.504 kg (140 lb) 56.972 kg (125 lb 9.6 oz) 57.788 kg (127 lb 6.4 oz)    Exam:   General:  NAD  Cardiovascular: S1, S2 RRR  Respiratory: CTA B  Abdomen: soft, nt, nd, bs+  Musculoskeletal: 1+ edema in LE bilaterally   Data Reviewed: Basic Metabolic Panel:  Recent Labs Lab 07/26/13 0517 07/27/13 0617  NA 137 135*  K 4.7 4.7  CL 99 99  CO2 28 30  GLUCOSE 100* 104*  BUN 10 8  CREATININE 0.51 0.51  CALCIUM 8.2* 8.0*   Liver Function Tests: No results found for this basename: AST, ALT, ALKPHOS, BILITOT, PROT, ALBUMIN,  in the last 168 hours No results found for this basename: LIPASE, AMYLASE,  in the last 168 hours No results found for this basename:  AMMONIA,  in the last 168 hours CBC:  Recent Labs Lab 07/26/13 0517 07/27/13 0617 07/28/13 0611 07/29/13 0540 08/01/13 0455  WBC 8.0 7.5 7.2 7.0 5.2  HGB 10.4* 8.8* 9.2* 9.3* 9.1*  HCT 33.1* 28.9* 29.5* 29.7* 28.8*  MCV 76.4* 78.5 77.8* 77.1* 77.0*  PLT 428* 393 393 480* 597*   Cardiac Enzymes: No results found for this basename: CKTOTAL, CKMB, CKMBINDEX, TROPONINI,  in the last 168 hours BNP (last 3 results) No results found for this basename: PROBNP,  in the last 8760 hours CBG: No results found for this basename: GLUCAP,  in the last 168 hours  Recent Results (from the past 240 hour(s))  SURGICAL PCR SCREEN     Status: None   Collection Time    07/25/13 11:05 AM      Result Value Ref Range Status   MRSA, PCR NEGATIVE  NEGATIVE Final   Staphylococcus aureus NEGATIVE  NEGATIVE Final   Comment:            The Xpert SA Assay (FDA     approved for NASAL specimens     in patients over 60 years of age),     is one component of     a comprehensive surveillance     program.  Test performance has     been validated by [employer] for patients greater     than or equal to 1  year old.     It is not intended     to diagnose infection nor to     guide or monitor treatment.     Studies: No results found.  Scheduled Meds: . aspirin EC  325 mg Oral Q breakfast  . docusate sodium  100 mg Oral BID  . ferrous sulfate  325 mg Oral Q breakfast  . methotrexate  7.5 mg Oral Q Mon  . multivitamin with minerals  1 tablet Oral Daily  . nicotine  14 mg Transdermal Daily  . pantoprazole  40 mg Oral Daily  . senna  1 tablet Oral BID   Continuous Infusions:   Principal Problem:   Hip fracture Active Problems:   Tobacco abuse   COPD (chronic obstructive pulmonary disease) with chronic bronchitis   Microcytic anemia   Pulmonary nodule, left   Femoral neck fracture   Rheumatoid arthritis   Knee fracture, right    Time spent:    [employer]  [facility] Pager [phone]. If 7PM-7AM, please contact night-coverage at [email or web address], password [facility] 08/01/2013, 2:02 PM  LOS: 9 days

## 2013-08-01 NOTE — Progress Notes (Signed)
UR chart review completed.  

## 2013-08-08 ENCOUNTER — Telehealth: Payer: Self-pay | Admitting: Orthopedic Surgery

## 2013-08-09 ENCOUNTER — Encounter: Payer: Self-pay | Admitting: Orthopedic Surgery

## 2013-08-09 ENCOUNTER — Other Ambulatory Visit: Payer: Self-pay | Admitting: *Deleted

## 2013-08-09 NOTE — Telephone Encounter (Signed)
Returned call to [place], and waited on the line for a Jailey Booton time but never got an answer. Will try again Monday.

## 2013-08-12 ENCOUNTER — Telehealth: Payer: Self-pay | Admitting: Orthopedic Surgery

## 2013-08-12 NOTE — Telephone Encounter (Signed)
[name], therapist at [facility] has questions about Joe Mccullough.  She is working with the hip, and said he also has a fractured right knee. The right knee contracture is getting worse and she wants to know what precautions she needs to follow with regards to the fractured knee.  Said she does Not want to cause any further damage to his knee.

## 2013-08-12 NOTE — Telephone Encounter (Signed)
GIVE ME HER NUMBER THE KNEE IS not FRACTURED

## 2013-08-12 NOTE — Telephone Encounter (Signed)
Left message for carolyn 08/12/13.

## 2013-08-12 NOTE — Telephone Encounter (Signed)
Her phone # is [phone]  Ext [phone]

## 2013-08-13 ENCOUNTER — Other Ambulatory Visit: Payer: Self-pay | Admitting: Family Medicine

## 2013-08-13 LAB — CLOSTRIDIUM DIFFICILE(ARMC)

## 2013-08-26 ENCOUNTER — Encounter: Payer: Self-pay | Admitting: Orthopedic Surgery

## 2013-08-26 ENCOUNTER — Ambulatory Visit (INDEPENDENT_AMBULATORY_CARE_PROVIDER_SITE_OTHER): Payer: Worker's Compensation | Admitting: Orthopedic Surgery

## 2013-08-26 VITALS — BP 110/78 | Ht 68.0 in | Wt 127.0 lb

## 2013-08-26 DIAGNOSIS — M8708 Idiopathic aseptic necrosis of bone, other site: Secondary | ICD-10-CM

## 2013-08-26 DIAGNOSIS — Z96649 Presence of unspecified artificial hip joint: Secondary | ICD-10-CM

## 2013-08-26 NOTE — Progress Notes (Signed)
Patient ID: Nat Christen., male   DOB: Jan 22, 1956, 58 y.o.   MRN: [id number]  Chief Complaint  Patient presents with  . Follow-up    Hospital follow up Right hip THA DOS 07/26/13    BP 110/78  Ht 5\' 8"  (1.727 m)  Wt 127 lb (57.607 kg)  BMI 19.31 kg/m2  Encounter Diagnoses  Name Primary?  . S/P hip replacement Yes  . Aseptic necrosis of other bone site     57 year old male premature arthritis fractures right hip and right total hip replacement. He also has avascular necrosis of the right knee and a knee flexion contracture. He is at a local rehabilitation facility basically bed to chair.  However, he should be able to ambulate with a walker and I've made orders to indicate that he also needs a knee brace with advancement of 10 per week to try to give her the knee flexion contracture. She will need any replacement.  Current living facility as right focal manner in   His hip looks good motion is good no complications or pain in the hip  I'll see him again in a month.

## 2013-08-27 ENCOUNTER — Telehealth: Payer: Self-pay | Admitting: Orthopedic Surgery

## 2013-08-27 NOTE — Telephone Encounter (Signed)
Call received from [facility] facility, per Clydie Braun, nurse case manager, called - [facility] # [phone] / FAX # [phone] --  - received the handwritten orders/report of physician visit 08/26/13, and has questions:    -- Asking if patient needs to continue have therapy in-house -- said he is scheduled to be discharged on Friday, 08/30/13, unless Dr Romeo Apple does not recommend.    -- Asking for orders for home therapy if patient is discharged to home.   (Also note - patient's After Visit Summary has no instructions typed in) Please advise.

## 2013-08-27 NOTE — Telephone Encounter (Signed)
Has hand written orders so no other orders are necessary  Will need orders for home therapy 3 x a day for 12 weeks

## 2013-08-28 NOTE — Telephone Encounter (Signed)
Faxed phone note to [facility] at [phone] on 08/28/13.

## 2013-08-29 NOTE — Telephone Encounter (Signed)
Routing to Dr Harrison 

## 2013-08-29 NOTE — Telephone Encounter (Addendum)
Patient's sister and designated contact, Myrick Mcnairy called back today, 08/29/13, to request a prescription for a wheelchair, to have in hand for patient's discharge from facility -- may be tomorrow, 08/30/13, and possibly may be extended for another week.  She states facility will not handle the order for the wheelchair.  Please contact her to pick up prescription.  Her ph# is [phone].

## 2013-08-29 NOTE — Telephone Encounter (Addendum)
08/29/13 cont'd *Update* Clydie Braun from [facility] confirmed that patient's date of discharge from their facility is extended to 09/07/13 date of discharge; therefore, they are requesting orders for in-patient therapy. Please advise.

## 2013-09-02 NOTE — Telephone Encounter (Signed)
ok 

## 2013-09-04 ENCOUNTER — Other Ambulatory Visit: Payer: Self-pay | Admitting: *Deleted

## 2013-09-04 DIAGNOSIS — M8708 Idiopathic aseptic necrosis of bone, other site: Secondary | ICD-10-CM

## 2013-09-04 DIAGNOSIS — Z96649 Presence of unspecified artificial hip joint: Secondary | ICD-10-CM

## 2013-09-04 NOTE — Telephone Encounter (Signed)
Put in order for wheelchair, and called to clarify if facility still needed orders for inpatient therapy. They will return my call tomorrow 09/05/13.

## 2013-09-05 NOTE — Telephone Encounter (Signed)
Order for wheel chair faxed to [facility], and never received a call back from facility regarding therapy.

## 2013-09-26 ENCOUNTER — Ambulatory Visit: Payer: Worker's Compensation | Admitting: Orthopedic Surgery

## 2013-11-04 ENCOUNTER — Ambulatory Visit: Payer: Self-pay | Admitting: Orthopedic Surgery

## 2013-11-05 ENCOUNTER — Ambulatory Visit: Payer: Self-pay | Admitting: Orthopedic Surgery

## 2013-11-25 ENCOUNTER — Ambulatory Visit (INDEPENDENT_AMBULATORY_CARE_PROVIDER_SITE_OTHER): Payer: BC Managed Care – PPO | Admitting: Orthopedic Surgery

## 2013-11-25 ENCOUNTER — Encounter: Payer: Self-pay | Admitting: Orthopedic Surgery

## 2013-11-25 VITALS — Ht 68.0 in | Wt 127.0 lb

## 2013-11-25 DIAGNOSIS — M069 Rheumatoid arthritis, unspecified: Secondary | ICD-10-CM

## 2013-11-25 DIAGNOSIS — M8708 Idiopathic aseptic necrosis of bone, other site: Secondary | ICD-10-CM

## 2013-11-25 DIAGNOSIS — Z96649 Presence of unspecified artificial hip joint: Secondary | ICD-10-CM

## 2013-11-25 NOTE — Patient Instructions (Signed)
Referral to [facility]

## 2013-11-25 NOTE — Progress Notes (Signed)
Chief Complaint  Patient presents with  . Follow-up    recheck right knee and right hip, right THA 07/26/13   Ht 5\' 8"  (1.727 m)  Wt 127 lb (57.607 kg)  BMI 19.31 kg/m2  Followup visits as right total hip in May of 2015 in this severely rheumatoid arthritis patient. Prior to his fracture had difficulty ambulating and difficulty with range of motion in both of his knees.  He presents now in followup 3 months after surgery with a knee flexion contracture on the right history of avascular necrosis in the right knee inability to straighten his right knee or his left knee. He is able to mobilize in transfer from wheelchair to bed and bed to chair but is not ambulate at this point. He does not complain of any hip pain he still complains of pain in his right knee  On examination we see a well-developed well-nourished male with multiple deformities related to rheumatoid arthritis. His upper extremities show rheumatoid arthritis like changes in his hands and wrist area. His right knee is flexed at approximately 90 and flexes to 130 but does not extend. The left knee extends to 70 and flexes to 130. He is tender over the medial femoral condyle of the right knee  His right hip motion remains normal  He sits in a chair he has a windswept deformity of his left lower extremity has ankle deformity of foot deformity bilaterally related to rheumatoid disease  The patient is from [facility] and the closest [facility] is [place]. I have informed him that he would best be served at a tertiary care type facility by a joint replacement specialist and we will be happy to arrange that for him a fever at [facility] or [facility].  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

## 2013-12-30 ENCOUNTER — Telehealth: Payer: Self-pay | Admitting: *Deleted

## 2013-12-30 NOTE — Telephone Encounter (Signed)
REFERRAL SENT TO [facility] PER REQUEST OF PATIENT TO BE CLOSER TO HOME PATIENT SAW DR Janett Billow 12/13/13

## 2015-10-07 IMAGING — CR DG PELVIS 1-2V
1 series · 1 of 1 positions shown · non-contrast
Comparison: CT on 01/30/2012

CLINICAL DATA: Fall.  Right pelvic and hip pain.

EXAM:
PELVIS - 1-2 VIEW

[view not recorded]
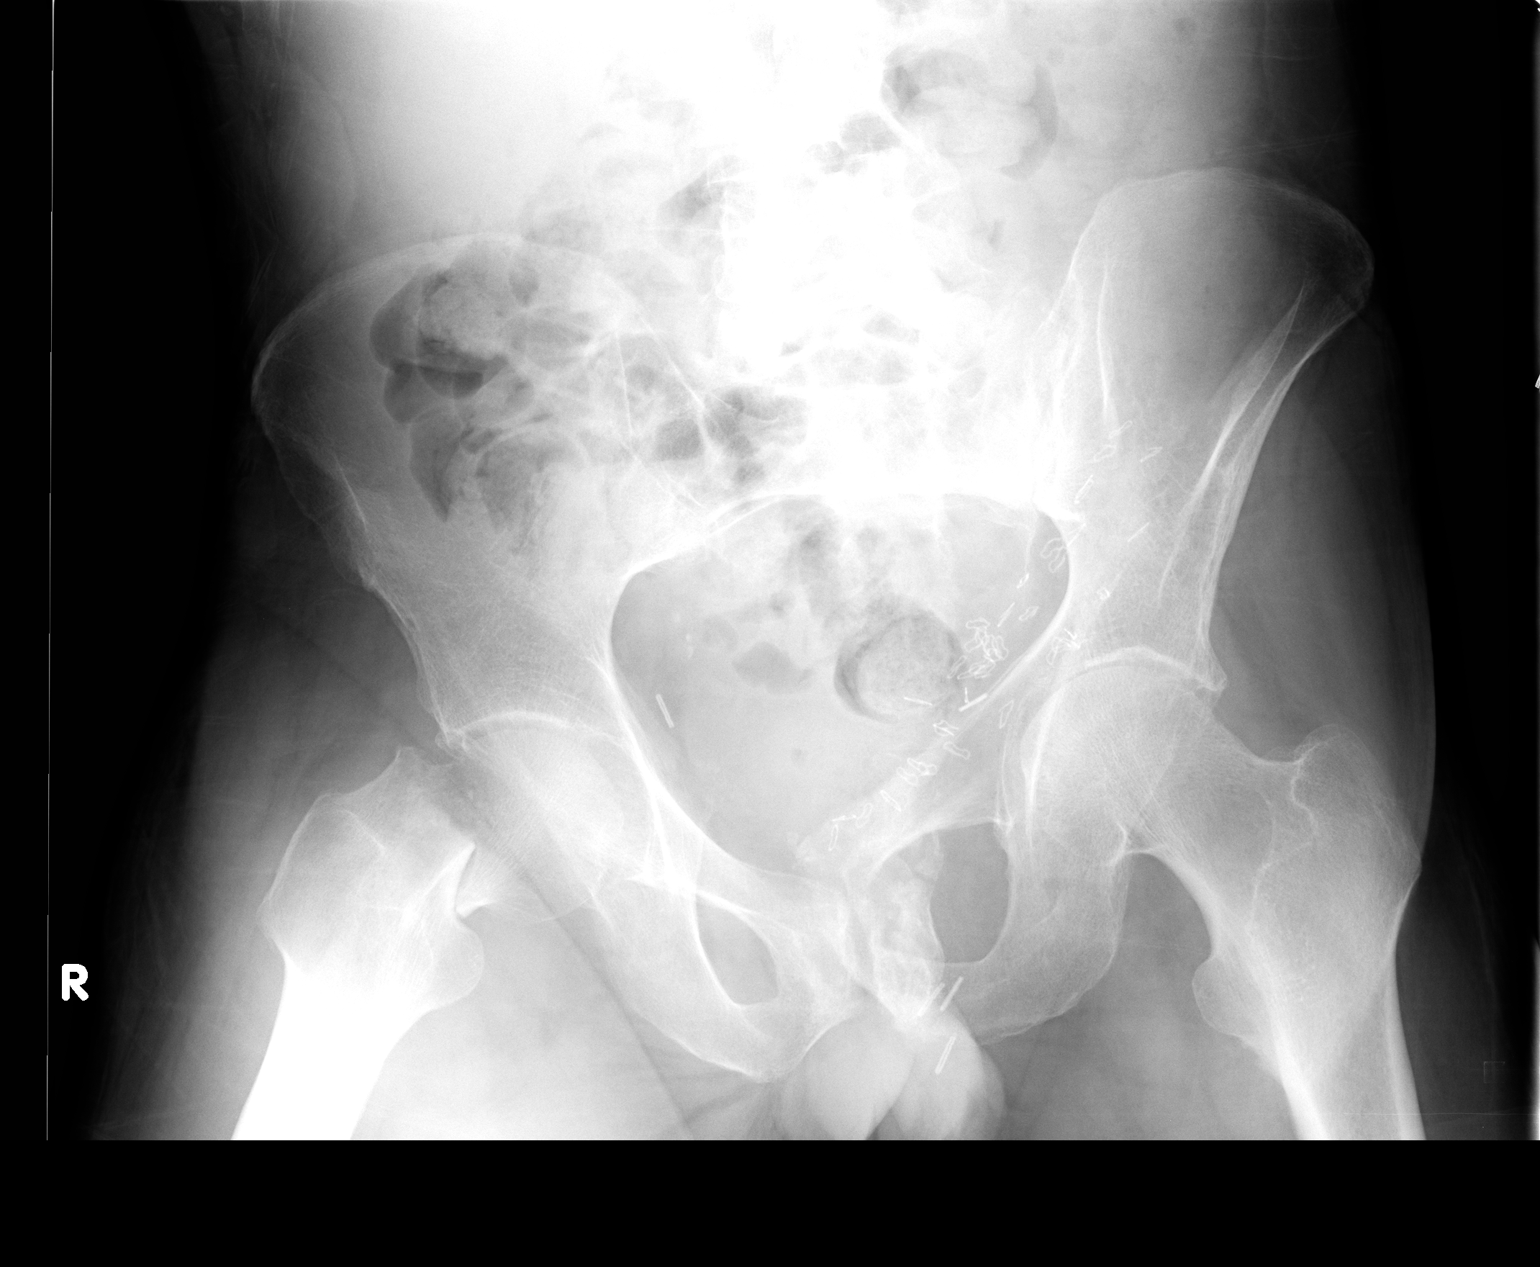

[1 of 1 positions shown; findings below may reference images not displayed]

FINDINGS: Acute fracture of the right femoral neck is seen. No evidence of hip
dislocation.

Fracture deformity of the left pubic bone is seen which appears
chronic. Multiple surgical clips and staples are seen in the left
pelvis. No acute pelvic fracture identified.
IMPRESSION: Acute right femoral neck fracture.

Old fracture deformity of the left pubis. No acute pelvic fracture
identified.

## 2015-10-10 IMAGING — CR DG KNEE 1-2V PORT*R*
1 series · 1 of 1 positions shown · non-contrast
Comparison: Plain films of the right knee 07/23/2013.

CLINICAL DATA: Right hip fracture.  Patient for fracture fixation.

EXAM:
PORTABLE RIGHT KNEE - 1-2 VIEW

[ap]
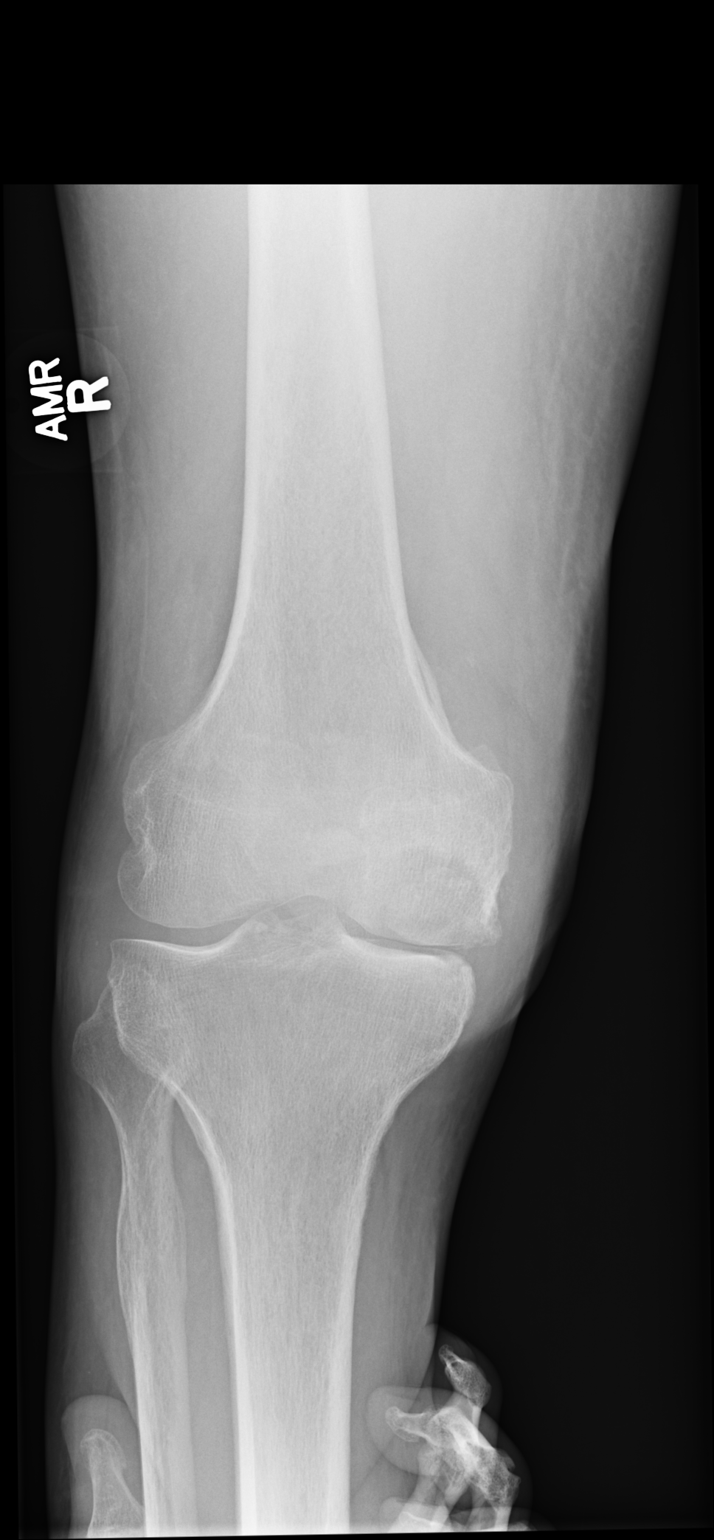

[1 of 1 positions shown; findings below may reference images not displayed]

FINDINGS: Single portable view is provided. No acute bony or joint abnormality
is identified. Degenerative change in the medial compartment is
noted with there is near bone-on-bone joint space narrowing. The
patient appears to have a remote healed proximal fibular fracture.
IMPRESSION: No acute finding.

Advanced medial compartment osteoarthritis.

## 2016-03-28 DEATH — deceased
